# Patient Record
Sex: Male | Born: 1940
Health system: Southern US, Community
[De-identification: ages and names within clinical notes are randomized; demographics above are authoritative.]

## PROBLEM LIST (undated history)

## (undated) DIAGNOSIS — Z8546 Personal history of malignant neoplasm of prostate: Secondary | ICD-10-CM

## (undated) DIAGNOSIS — E785 Hyperlipidemia, unspecified: Secondary | ICD-10-CM

## (undated) DIAGNOSIS — B019 Varicella without complication: Secondary | ICD-10-CM

## (undated) DIAGNOSIS — I10 Essential (primary) hypertension: Secondary | ICD-10-CM

## (undated) DIAGNOSIS — I519 Heart disease, unspecified: Secondary | ICD-10-CM

## (undated) DIAGNOSIS — M1990 Unspecified osteoarthritis, unspecified site: Secondary | ICD-10-CM

## (undated) DIAGNOSIS — I252 Old myocardial infarction: Secondary | ICD-10-CM

## (undated) DIAGNOSIS — N200 Calculus of kidney: Secondary | ICD-10-CM

## (undated) DIAGNOSIS — R519 Headache, unspecified: Secondary | ICD-10-CM

## (undated) DIAGNOSIS — C61 Malignant neoplasm of prostate: Secondary | ICD-10-CM

## (undated) DIAGNOSIS — R51 Headache: Secondary | ICD-10-CM

## (undated) DIAGNOSIS — I2510 Atherosclerotic heart disease of native coronary artery without angina pectoris: Secondary | ICD-10-CM

## (undated) HISTORY — PX: APPENDECTOMY: SHX54

## (undated) HISTORY — DX: Headache: R51

## (undated) HISTORY — DX: Calculus of kidney: N20.0

## (undated) HISTORY — DX: Old myocardial infarction: I25.2

## (undated) HISTORY — DX: Hyperlipidemia, unspecified: E78.5

## (undated) HISTORY — DX: Unspecified osteoarthritis, unspecified site: M19.90

## (undated) HISTORY — DX: Essential (primary) hypertension: I10

## (undated) HISTORY — DX: Personal history of malignant neoplasm of prostate: Z85.46

## (undated) HISTORY — DX: Headache, unspecified: R51.9

## (undated) HISTORY — DX: Atherosclerotic heart disease of native coronary artery without angina pectoris: I25.10

## (undated) HISTORY — DX: Malignant neoplasm of prostate: C61

## (undated) HISTORY — DX: Varicella without complication: B01.9

## (undated) HISTORY — DX: Heart disease, unspecified: I51.9

---

## 1998-12-22 HISTORY — PX: CORONARY ANGIOPLASTY WITH STENT PLACEMENT: SHX49

## 1999-07-15 DIAGNOSIS — I252 Old myocardial infarction: Secondary | ICD-10-CM

## 1999-07-15 HISTORY — DX: Old myocardial infarction: I25.2

## 1999-08-02 ENCOUNTER — Observation Stay (HOSPITAL_COMMUNITY): Admission: AD | Admit: 1999-08-02 | Discharge: 1999-08-03 | Payer: Self-pay | Admitting: Cardiovascular Disease

## 2002-06-09 ENCOUNTER — Ambulatory Visit (HOSPITAL_COMMUNITY): Admission: RE | Admit: 2002-06-09 | Discharge: 2002-06-10 | Payer: Self-pay | Admitting: Otolaryngology

## 2002-06-09 ENCOUNTER — Encounter: Payer: Self-pay | Admitting: Ophthalmology

## 2002-06-09 HISTORY — PX: RETINAL DETACHMENT REPAIR W/ SCLERAL BUCKLE LE: SHX2338

## 2004-08-28 ENCOUNTER — Encounter (INDEPENDENT_AMBULATORY_CARE_PROVIDER_SITE_OTHER): Payer: Self-pay | Admitting: Specialist

## 2004-08-28 ENCOUNTER — Ambulatory Visit (HOSPITAL_COMMUNITY): Admission: RE | Admit: 2004-08-28 | Discharge: 2004-08-28 | Payer: Self-pay | Admitting: Gastroenterology

## 2004-10-15 ENCOUNTER — Encounter: Admission: RE | Admit: 2004-10-15 | Discharge: 2004-10-15 | Payer: Self-pay | Admitting: General Surgery

## 2004-10-17 ENCOUNTER — Ambulatory Visit (HOSPITAL_COMMUNITY): Admission: RE | Admit: 2004-10-17 | Discharge: 2004-10-17 | Payer: Self-pay | Admitting: General Surgery

## 2004-10-17 ENCOUNTER — Ambulatory Visit (HOSPITAL_BASED_OUTPATIENT_CLINIC_OR_DEPARTMENT_OTHER): Admission: RE | Admit: 2004-10-17 | Discharge: 2004-10-17 | Payer: Self-pay | Admitting: General Surgery

## 2004-10-17 HISTORY — PX: UMBILICAL HERNIA REPAIR: SHX196

## 2005-06-06 ENCOUNTER — Encounter: Admission: RE | Admit: 2005-06-06 | Discharge: 2005-06-06 | Payer: Self-pay | Admitting: Orthopedic Surgery

## 2006-07-06 ENCOUNTER — Encounter (INDEPENDENT_AMBULATORY_CARE_PROVIDER_SITE_OTHER): Payer: Self-pay | Admitting: Specialist

## 2006-07-06 ENCOUNTER — Inpatient Hospital Stay (HOSPITAL_COMMUNITY): Admission: RE | Admit: 2006-07-06 | Discharge: 2006-07-07 | Payer: Self-pay | Admitting: Urology

## 2006-07-06 HISTORY — PX: ROBOT ASSISTED LAPAROSCOPIC RADICAL PROSTATECTOMY: SHX5141

## 2008-03-31 ENCOUNTER — Ambulatory Visit (HOSPITAL_COMMUNITY): Admission: RE | Admit: 2008-03-31 | Discharge: 2008-03-31 | Payer: Self-pay | Admitting: Urology

## 2011-05-09 NOTE — Op Note (Signed)
NAME:  Vincent Brock, Vincent Brock NO.:  [id number]   MEDICAL RECORD NO.:  [id number]          PATIENT TYPE:  INP   LOCATION:  0002                         FACILITY:  [facility]   PHYSICIAN:  Valetta Fuller, M.D.  DATE OF BIRTH:  November 09, 1941   DATE OF PROCEDURE:  07/06/2006  DATE OF DISCHARGE:                                 OPERATIVE REPORT   PREOPERATIVE DIAGNOSIS:  Clinical stage T1C adenocarcinoma of the prostate.   POSTOPERATIVE DIAGNOSIS:  Clinical stage T1C adenocarcinoma of the prostate.   PROCEDURE PERFORMED:  Robotic-assisted laparoscopic radical retropubic  prostatectomy.   SURGEON:  Valetta Fuller, M.D.   ASSISTANT:  Crecencio Mc, M.D.   ANESTHESIA:  General endotracheal.   ESTIMATED BLOOD LOSS:  100 cc.   INDICATIONS:  Mr. Jachim is a 70 year old male who was sent to me through  the courtesy of Dr. Marcine Matar to consider the possibility of robotic-  assisted prostatectomy for management of his clinical stage T1C  adenocarcinoma of the prostate.  The patient has a minimally elevated PSA of  approximately 5 with a slightly reduced PSA 2 reading.  He had a moderately  enlarged prostate of 60 g with an AUA symptom score of approximately 12.  The patient had been under the care of Dr. Retta Diones in the past for  significant erectile dysfunction and had been utilizing penile injections  with reasonably good success.  The patient underwent ultrasound biopsy which  revealed left-sided Gleason 3 + 3=6 adenocarcinoma of the prostate.  This  was a relatively low volume with approximately 20% of the biopsy material  involved with cancer.  The patient had extensive discussions with Dr.  Retta Diones and elected to have surgical intervention.  He came to see me to  specifically discuss the pros and cons of a robotic approach for this type  of surgery.  The patient already has significant erectile dysfunction and,  therefore, did not feel that he would be necessarily a  candidate for nerve  spare.  We felt that potentially a larger area of margins by resecting the  nerve, especially on that side, would be prudent since he was already on  penile injections.  The patient appeared to understand the advantages and  disadvantages of a surgical approach in managing this problem.  This was  compared and contrasted to radiation versus observation.  The patient  appeared to understand the advantages and disadvantages of a robotic  approach versus a standard open prostatectomy.  We did have extensive  discussion in our office with regard to potential complications.  We talked  about approximately a 10% to 15% chance of having some stress incontinence  of variable degree.  Again, erectile dysfunction is going to 100% given his  preoperative setting.  We talked about other issues including bladder neck  contracture, urinary incontinence, hematoma formation, DVT, pulmonary  embolus, etc.  The patient did receive preoperative cardiac clearance from  Dr. Elease Hashimoto.   TECHNIQUE AND FINDINGS:  The patient was brought to the operating room where  he had successful induction of general endotracheal anesthesia.  He had  undergone  a mechanical bowel prep and had received perioperative  antibiotics.  He was placed in lithotomy position and carefully secured to  the table utilizing gelpad, as well.  The patient was then placed in  moderately steep Trendelenburg and prepped and draped in the usual manner.  A Foley catheter was inserted sterilely on the field.   We utilized an open Hasson technique to place the camera port cannula.  A  position was chosen just to the left of the umbilicus.  We went lateral to  where he had what appeared to be a mesh umbilical hernia repair.  This site  was approximately 18 cm above the pubic symphysis.  Using standard  techniques, we were able to identify the anterior fascia which was then  incised.  Posterior sheath was then opened.  There was no  evidence of any  abdominal adhesions with finger sweep and the cannula was placed.  Insufflation of the abdomen occurred without difficulty.  The remaining  ports were all placed with direct visual guidance.  We placed three robotic  8-mm trocars, one 12-mm assist, and one 5-mm assist trocar.  These were  placed in a fairly stair configuration and again with direct visual  guidance.  At the completion of this, the robot was then docked.   Attention was initially turned toward dissection of the bladder.  This was  dropped posteriorly by opening the fascia between the obliterated umbilical  ligaments and developing the space of Retzius.  Once this was accomplished,  the lateral pelvic fascia was incised overlying the prostate and the levator  musculature was swept away utilizing a combination of sharp and blunt  dissective technique.  We also defatted the prostate and bladder neck region  to help identify the apex and bladder neck regions.  Once the apex was fully  developed, an ETS stapling device was used to take the dorsal vein complex  and no significant bleeding occurred.   With manipulation of the catheter and the Foley balloon, we were able to  identify the bladder neck.  Anteriorly, this was then taken down utilizing  electric scissors.  Once the Foley catheter was identified, it was brought  up anteriorly and the prostate was retracted anteriorly.  Careful inspection  revealed no obvious middle lobe and the posterior bladder neck was then  transected.  Once we had completely transected the bladder neck, the  underlying vas and seminal vesicle structures were identified in the  midline.  Each of these structures were individually dissected free.  These  planes were all quite normal.  There was no significant difficulty in  identifying and completely dissecting free both vas deferens which were  transected with electrocautery and the seminal vesicles.  Once this was accomplished, we  were able to establish a very nice plane posteriorly  between the prostate and rectum.   Attention was then turned toward the division of the pedicles.  On the right  side, the neurovascular bundle area was swept away to a leftward nerve  sparing on that side.  On the left, it was a wide excision.  By lifting the  prostate anteriorly, the vascular pedicles of the prostate were taken down  with Hemoclips.  A little bit of lateral fascia was taken off at the apex  bilaterally.  The urethra was then transected anteriorly.  The Foley  catheter was removed and the entire urethra was then transected.  The  prostatic specimen was then free and brought up out of  the pelvis.   Copious irrigation in the pelvis was then performed.  Air was insufflated  into the rectum through a rectal Foley and no evidence of any rectal injury  was encountered.  Some small oozing vessels were taken care of with the  bipolar cautery.   Attention was then turned to reconstruction.  There was a very nice urethral  stump.  The anastomosis was done with a double-armed running 3-0 Monocryl  suture in a 360-degree manner, starting posteriorly.  At the completion of  this, a new 18-French coude catheter was placed.  The bladder was irrigated  and no evidence of leakage occurred.  Through one of the robotic trocars, we  placed a Blake drain and it was positioned at the anastomosis down deep in  the pelvis.  This was then secured to the anterior abdominal wall.  The  specimen itself was removed with the Endopouch.  Of note, prior to the  anastomosis, there was a small little area of nodularity right at the  posterior aspect of the bladder neck.  For that reason, we took an  additional margin of the posterior bladder neck which was sent separately.  This was sent for permanent specimen only.  There was the possibility that  there might have been a very, very small little middle lobe component just  underneath the mucosa in  that area.  Also of note, the ureteral orifices  were identified during the transection, as well as reconstruction by  administrating indigo carmine.  The 12-mm assist port was closed with a  Vicryl suture utilizing a suture passer.  All the ports were removed with  direct vision.  Once the specimen was removed, the camera port incision was  closed with a running 0 Vicryl suture.  All wounds were  infiltrated with some Marcaine and skin was closed with clips.  The  patient's urine was a light blue at the completion of the procedure.  Again,  blood loss was minimal at about 100 cc.  He remained stable throughout the  procedure.  He was brought to the recovery room in stable condition.           ______________________________  Valetta Fuller, M.D.  Electronically Signed     DSG/MEDQ  D:  07/06/2006  T:  07/06/2006  Job:  [id number]   cc:   Vesta Mixer, M.D.  Fax: [phone]   Elana Alm. Nicholos Johns, M.D.  Fax: [phone]   Bertram Millard. Dahlstedt, M.D. Fax: [phone]

## 2011-05-09 NOTE — Op Note (Signed)
NAME:  Vincent Brock, Vincent Brock NO.:  [id number]   MEDICAL RECORD NO.:  [id number]                   PATIENT TYPE:  AMB   LOCATION:  DFTL                                 FACILITY:  [facility]   PHYSICIAN:  Anselmo Rod, M.D.               DATE OF BIRTH:  05-13-1941   DATE OF PROCEDURE:  08/28/2004  DATE OF DISCHARGE:                                 OPERATIVE REPORT   PROCEDURE PERFORMED:  Colonoscopy with biopsies x4.   ENDOSCOPIST:  Charna Elizabeth, M.D.   INSTRUMENT USED:  Olympus video colonoscope.   INDICATION FOR PROCEDURE:  A 70 year old white male with a history of blood  in stool, rule out colonic polyps, masses, etc.   PROCEDURE PREPARATION:  Informed consent was procured from the patient.  The  patient was fasted for 8 hours prior to the procedure and prepped with a  bottle of magnesium citrate and a gallon of GoLYTELY the night prior to the  procedure.   PREPROCEDURE PHYSICAL:  Patient stable vital signs.  NECK:  Supple.  CHEST:  Clear to auscultation.  S1, S2 regular.  ABDOMEN:  Soft with normal bowel sounds.   DESCRIPTION OF THE PROCEDURE:  The patient was placed the left lateral  decubitus position, sedated with 100 mg of Demerol and 10 mg of Versed  intravenously.  This was done in slow incremental doses.  Once the patient  was adequately sedated and maintained on low flow oxygen and continuous  cardiac monitoring, the Olympus video colonoscope was advanced from the  rectum to the cecum with difficulty.  There was a large amount of residual  stool in the colon, multiple washings were done.  There was extensive  sigmoid diverticulosis, internal hemorrhoids were seen.  On retroflexion, 4  small sessile polyps were biopsied from the rectosigmoid colon.  Small  lesions could have been missed.  The procedure was completed up to the  cecum.  The patient's position was changed from the left lateral to the  supine position with gentle application of  abdominal pressure.   IMPRESSION:  1.  Small internal hemorrhoids.  2.  Extensive sigmoid diverticulosis.  3.  Four small sessile polyps biopsied from rectosigmoid colon.  4.  Some residual stool in the colon, small lesions could have been missed.   RECOMMENDATIONS:  1.  Await pathology results.  2.  Avoid nonsteroidals including aspirin for the next 2 weeks.  3.  Outpatient followup in the next 2 weeks for further recommendations.                                               Anselmo Rod, M.D.    JNM/MEDQ  D:  08/28/2004  T:  08/28/2004  Job:  [id number]   cc:   Gabriel Earing,  M.D.  [address]  [place]  [place] [address]  Fax: [phone]

## 2011-05-09 NOTE — Op Note (Signed)
[place]  Patient:    Vincent Brock, Vincent Brock Visit Number: [id number] MRN: [id number]          Service Type: DSU Location: [id number] Attending Physician:  Bertrum Sol Dictated by:   Beulah Gandy. Ashley Royalty, M.D. Proc. Date: 06/09/02 Admit Date:  06/09/2002 Discharge Date: 06/10/2002                             Operative Report  DATE OF BIRTH:  [birth year].  ADMISSION DIAGNOSIS: Retinal detachment, right eye.  PROCEDURES PERFORMED: 1. Scleral buckle right eye. 2. Pars plana vitrectomy right eye. 3. Gas fluid exchange right eye. 4. Perfluoropropane injection right eye. 5. Membrane peel right eye. 6. C3F8 injection 14% right eye.  SURGEON:  Beulah Gandy. Ashley Royalty, M.D.  ASSISTANT SURGEON:  Winfred Burn, COA, [place].  ANESTHESIA:  General.  DETAILS: Usual prep and drape. A 360 degree limbal peritomy isolation of four rectus muscles on 2-0 silk. Localization of break at 5 oclock. Scleral dissection from 2 oclock to 10 oclock to admit a #279 intrascleral implant. Diathermy was placed in the bed. Additional posterior dissection was carried out at 5 oclock to admit a #508G radial segment. A #279 implant was placed against the globe and the scleral flaps were closed with 4-0 Mersilene suture. A 240 band was placed around the eye with a belt loop at 12 oclock and a 270 sleeve at 1 oclock.  The perforation site chosen at 5 oclock in the posterior aspect of the bed. A large amount of clear, colorless subretinal fluid came fourth. The scleral buckle elements were replaced and these flaps were closed. 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 The edge of the tear was elevated and rolled.  At this point a decision for vitrectomy was made. The attention was carried to the pars plana area where sclerotomies were made at 8, 10 and 2 oclock. A 6 mm infusion port was placed at 8 oclock. The  contact lens ring was anchored in place at 6 and 12 oclock. The pars plana vitrectomy was begun just behind the pseudophakos. Dense vitreous membranes were encountered. These were carefully removed under low suction and rapid cutting.  The attention was carried to the area of detachment and the folds were encountered. These were carefully stripped with the 27 gauge pick and vitreous forceps. Once all of the folds were unfolded and the vitreous surface tissue was removed perfluoron was injected into the vitreous cavity to reattach the retina.  At this point the endolaser was positioned in the eye and 207 burns were placed around the retinal break with a power of 400 milliwatts, 1000 microns each at 0.1 seconds each. The retina was fully attached.  A gas-fluid exchange was then carried out. A gas exchange was then carried out with 14% perfluoropropane remaining in the vitreous cavity. The instruments were removed from the eye and 9-0 nylon was used to close the sclerotomy sites. The scleral buckle was closed with 4-0 interrupted Mersilene suture.  The band was adjusted to a proper indentation of the globe. The conjunctiva was reposited with 7-0 chromic suture. Polymyxin and gentamicin were irrigated into Tenons space. Atropine solution was applied. Decadron 10 mg was injected into the lower subconjunctival space. Marcaine was injected around the globe for postoperative pain. Polysporin, a patch and shield were placed.  The patient was awakened and taken to the  recovery room in satisfactory condition. Closing tension was 10 with a Barraquer tonometer. There no complications. Duration was two hours. The patient was awakened and taken to the recovery room in satisfactory condition. Dictated by:   Beulah Gandy. Ashley Royalty, M.D. Attending Physician:  Bertrum Sol DD:  06/09/02 TD:  06/11/02 Job: [id number]

## 2011-05-09 NOTE — Op Note (Signed)
NAME:  LUNDEN, MCLEISH NO.:  [id number]   MEDICAL RECORD NO.:  [id number]          PATIENT TYPE:  AMB   LOCATION:  DSC                          FACILITY:  [facility]   PHYSICIAN:  Adolph Pollack, M.D.DATE OF BIRTH:  [birth year]   DATE OF PROCEDURE:  10/17/2004  DATE OF DISCHARGE:                                 OPERATIVE REPORT   PREOPERATIVE DIAGNOSIS:  Umbilical hernia.   POSTOPERATIVE DIAGNOSIS:  Umbilical hernia.   OPERATION PERFORMED:  Umbilical hernia repair with mesh.   SURGEON:  Adolph Pollack, M.D.   ANESTHESIA:  General.   INDICATIONS FOR PROCEDURE:  Mr. Vincent Brock is a 70 year old male who has  noticed an umbilical bulge that is starting to become larger and somewhat  uncomfortable at times.  It reducible in the office.  It is consistent with  an umbilical hernia and he now presents for repair.  The procedure and the  risks were discussed with him preoperatively.  He has been having some sinus  drainage and a little bit of a cough but thinks it is just a sinus problem  and not a upper respiratory infection.  He has been afebrile.  After  discussion with him, he wants to proceed.  Temperature this morning is 97.   DESCRIPTION OF PROCEDURE:  He was seen in the holding area, then brought to  the operating room and placed supine on the operating room table and general  anesthetic was administered.  The periumbilical area was clipped of its  hair, then sterilely prepped and draped.  Local anesthetic consisting of  0.5% plain Marcaine was infiltrated in the periumbilical region and a  subumbilical incision was made through the skin and subcutaneous tissue down  to the fascia.  I then encircled the umbilicus and dissected it free from  the fascia exposing the underlying umbilical hernia.  Using electrocautery I  exposed the fascia for 3 to 4 cm around the defect.  I then permanently  closed the defect with 0 Surgilon sutures leaving the sutures long.  A  piece  of polypropylene mesh was then brought onto the field and cut to appropriate  size.  The sutures that have previously been used for the primary repair  were then threaded up through the mesh and then they were tied down  anchoring the mesh over the primary repair.  The periphery of the mesh was  then anchored to the fascia with running 0 Surgilon suture.  This provided  more than adequate coverage of the primary repair and defect as well as with  adequate overlap.   Hemostasis was adequate at this time.  The umbilicus was reimplanted to the  mesh with a 3-0 Vicryl suture.  The subcutaneous tissue was closed over the  mesh with a running 3-0 Vicryl suture.  The skin was closed with a 4-0  Monocryl subcutaneous stitch.  Steri-Strips and a sterile dressing were  applied.   The patient tolerated the procedure well without any apparent complications  and was taken to the recovery room in satisfactory condition.  Discharge  instructions will be given to him  as well as some Tylox for pain and he will  follow up with me in two to three weeks.       TJR/MEDQ  D:  10/17/2004  T:  10/17/2004  Job:  [id number]   cc:   Anselmo Rod, M.D.  [address]  [address]  [place]  [place] [address]  Fax: [phone]

## 2011-05-30 ENCOUNTER — Other Ambulatory Visit: Payer: Self-pay | Admitting: Cardiovascular Disease

## 2011-05-30 ENCOUNTER — Telehealth: Payer: Self-pay | Admitting: *Deleted

## 2011-05-30 NOTE — Telephone Encounter (Signed)
Pt called and said was given a script through amy rn/nahser a few months ago but was only given one refill, i called and left msg he need an appointment.Alfonso Ramus RN

## 2011-05-30 NOTE — Telephone Encounter (Signed)
Pt called no current chart and not seen in hospital, dr Ian Bushman ok with one refill, needs app.Alfonso Ramus RN

## 2011-06-02 ENCOUNTER — Telehealth: Payer: Self-pay | Admitting: Cardiovascular Disease

## 2011-06-02 ENCOUNTER — Other Ambulatory Visit: Payer: Self-pay | Admitting: *Deleted

## 2011-06-02 DIAGNOSIS — E785 Hyperlipidemia, unspecified: Secondary | ICD-10-CM

## 2011-06-02 NOTE — Telephone Encounter (Signed)
Called in wanting to

## 2011-06-02 NOTE — Telephone Encounter (Signed)
Lab and ov set

## 2011-06-12 ENCOUNTER — Telehealth: Payer: Self-pay | Admitting: Cardiovascular Disease

## 2011-06-19 ENCOUNTER — Other Ambulatory Visit: Payer: Self-pay | Admitting: Orthopedic Surgery

## 2011-06-19 ENCOUNTER — Encounter: Payer: Self-pay | Admitting: *Deleted

## 2011-06-19 ENCOUNTER — Other Ambulatory Visit (HOSPITAL_COMMUNITY): Payer: Self-pay | Admitting: Orthopedic Surgery

## 2011-06-19 ENCOUNTER — Telehealth: Payer: Self-pay | Admitting: Cardiovascular Disease

## 2011-06-19 ENCOUNTER — Encounter (HOSPITAL_COMMUNITY): Payer: Medicare Other

## 2011-06-19 ENCOUNTER — Ambulatory Visit (HOSPITAL_COMMUNITY)
Admission: RE | Admit: 2011-06-19 | Discharge: 2011-06-19 | Disposition: A | Discharge status: Home / Self Care | Payer: Medicare Other | Source: Ambulatory Visit | Attending: Orthopedic Surgery | Admitting: Orthopedic Surgery

## 2011-06-19 DIAGNOSIS — Z01818 Encounter for other preprocedural examination: Secondary | ICD-10-CM

## 2011-06-19 DIAGNOSIS — I10 Essential (primary) hypertension: Secondary | ICD-10-CM | POA: Insufficient documentation

## 2011-06-19 DIAGNOSIS — I252 Old myocardial infarction: Secondary | ICD-10-CM | POA: Insufficient documentation

## 2011-06-19 DIAGNOSIS — M1710 Unilateral primary osteoarthritis, unspecified knee: Secondary | ICD-10-CM | POA: Insufficient documentation

## 2011-06-19 DIAGNOSIS — Z01812 Encounter for preprocedural laboratory examination: Secondary | ICD-10-CM | POA: Insufficient documentation

## 2011-06-19 DIAGNOSIS — I2510 Atherosclerotic heart disease of native coronary artery without angina pectoris: Secondary | ICD-10-CM | POA: Insufficient documentation

## 2011-06-19 DIAGNOSIS — Z01810 Encounter for preprocedural cardiovascular examination: Secondary | ICD-10-CM | POA: Insufficient documentation

## 2011-06-19 LAB — DIFFERENTIAL
Basophils Absolute: 0 10*3/uL (ref 0.0–0.1)
Basophils Relative: 0 % (ref 0–1)
Eosinophils Absolute: 0.1 10*3/uL (ref 0.0–0.7)
Eosinophils Relative: 3 % (ref 0–5)
Lymphocytes Relative: 35 % (ref 12–46)
Monocytes Absolute: 0.4 10*3/uL (ref 0.1–1.0)
Monocytes Relative: 10 % (ref 3–12)
Neutro Abs: 2.2 10*3/uL (ref 1.7–7.7)
Neutrophils Relative %: 52 % (ref 43–77)

## 2011-06-19 LAB — CBC
HCT: 44 % (ref 39.0–52.0)
Hemoglobin: 14.6 g/dL (ref 13.0–17.0)
MCH: 30.4 pg (ref 26.0–34.0)
MCHC: 33.2 g/dL (ref 30.0–36.0)
MCV: 91.5 fL (ref 78.0–100.0)
Platelets: 202 10*3/uL (ref 150–400)
RBC: 4.81 MIL/uL (ref 4.22–5.81)
WBC: 4.3 10*3/uL (ref 4.0–10.5)

## 2011-06-19 LAB — URINALYSIS, ROUTINE W REFLEX MICROSCOPIC
Bilirubin Urine: NEGATIVE
Glucose, UA: NEGATIVE mg/dL
Ketones, ur: NEGATIVE mg/dL
Leukocytes, UA: NEGATIVE
Nitrite: NEGATIVE
Protein, ur: NEGATIVE mg/dL
Specific Gravity, Urine: 1.018 (ref 1.005–1.030)
Urobilinogen, UA: 0.2 mg/dL (ref 0.0–1.0)
pH: 7 (ref 5.0–8.0)

## 2011-06-19 LAB — COMPREHENSIVE METABOLIC PANEL
ALT: 19 U/L (ref 0–53)
AST: 22 U/L (ref 0–37)
Albumin: 3.8 g/dL (ref 3.5–5.2)
Alkaline Phosphatase: 79 U/L (ref 39–117)
BUN: 15 mg/dL (ref 6–23)
CO2: 29 mEq/L (ref 19–32)
Calcium: 9.3 mg/dL (ref 8.4–10.5)
Chloride: 105 mEq/L (ref 96–112)
Creatinine, Ser: 0.81 mg/dL (ref 0.50–1.35)
GFR calc Af Amer: >60 mL/min (ref >60)
GFR calc non Af Amer: >60 mL/min (ref >60)
Glucose, Bld: 99 mg/dL (ref 70–99)
Potassium: 4.6 mEq/L (ref 3.5–5.1)
Sodium: 141 mEq/L (ref 135–145)
Total Bilirubin: 0.5 mg/dL (ref 0.3–1.2)
Total Protein: 7 g/dL (ref 6.0–8.3)

## 2011-06-19 LAB — URINE MICROSCOPIC-ADD ON

## 2011-06-19 LAB — PROTIME-INR
INR: 1.1 (ref 0.00–1.49)
Prothrombin Time: 14.4 seconds (ref 11.6–15.2)

## 2011-06-19 LAB — SURGICAL PCR SCREEN
MRSA, PCR: NEGATIVE
Staphylococcus aureus: NEGATIVE

## 2011-06-19 LAB — APTT: aPTT: 37 seconds (ref 24–37)

## 2011-06-19 NOTE — Telephone Encounter (Signed)
Fax: [phone] OV, STRESS

## 2011-06-26 ENCOUNTER — Ambulatory Visit (INDEPENDENT_AMBULATORY_CARE_PROVIDER_SITE_OTHER): Payer: Medicare Other | Admitting: Cardiovascular Disease

## 2011-06-26 ENCOUNTER — Other Ambulatory Visit (INDEPENDENT_AMBULATORY_CARE_PROVIDER_SITE_OTHER): Payer: Medicare Other | Admitting: *Deleted

## 2011-06-26 ENCOUNTER — Encounter: Payer: Self-pay | Admitting: Cardiovascular Disease

## 2011-06-26 VITALS — BP 120/88 | HR 66 | Wt 190.0 lb

## 2011-06-26 DIAGNOSIS — E785 Hyperlipidemia, unspecified: Secondary | ICD-10-CM

## 2011-06-26 DIAGNOSIS — I2510 Atherosclerotic heart disease of native coronary artery without angina pectoris: Secondary | ICD-10-CM

## 2011-06-26 DIAGNOSIS — Z01810 Encounter for preprocedural cardiovascular examination: Secondary | ICD-10-CM

## 2011-06-26 LAB — BASIC METABOLIC PANEL
BUN: 18 mg/dL (ref 6–23)
CO2: 24 mEq/L (ref 19–32)
Calcium: 8.7 mg/dL (ref 8.4–10.5)
Chloride: 110 mEq/L (ref 96–112)
Creatinine, Ser: 0.9 mg/dL (ref 0.4–1.5)
GFR: 87.73 mL/min (ref >60.00)
Glucose, Bld: 111 mg/dL — ABNORMAL HIGH (ref 70–99)
Potassium: 3.9 mEq/L (ref 3.5–5.1)
Sodium: 138 mEq/L (ref 135–145)

## 2011-06-26 LAB — LIPID PANEL
Cholesterol: 125 mg/dL (ref 0–200)
HDL: 53.3 mg/dL (ref >39.00)
LDL Cholesterol: 59 mg/dL (ref 0–99)
Total CHOL/HDL Ratio: 2
Triglycerides: 62 mg/dL (ref 0.0–149.0)
VLDL: 12.4 mg/dL (ref 0.0–40.0)

## 2011-06-26 LAB — HEPATIC FUNCTION PANEL
ALT: 20 U/L (ref 0–53)
AST: 23 U/L (ref 0–37)
Albumin: 4.1 g/dL (ref 3.5–5.2)
Alkaline Phosphatase: 66 U/L (ref 39–117)
Bilirubin, Direct: 0.1 mg/dL (ref 0.0–0.3)
Total Bilirubin: 0.7 mg/dL (ref 0.3–1.2)
Total Protein: 6.9 g/dL (ref 6.0–8.3)

## 2011-06-26 NOTE — Progress Notes (Signed)
Vincent Brock Date of Birth  17-May-1941 [facility] [address]     [address] [place], [place]  [address] [phone]  Fax  [phone]  History of Present Illness:  Pt has a history of CAD - stent to the mid RCA.  Doing well. No angina.  Not exercising as much as he needs to due to knee problems. Needs to have total knee.  He has not had any episodes of chest pain quite sometime. His last stress test was in 2007 which Revealed an old inferior wall myocardial infarction but was otherwise normal.    Current Outpatient Prescriptions on File Prior to Visit  Medication Sig Dispense Refill  . CRESTOR 20 MG tablet Take 1 tablet by mouth Daily.      . metoprolol (LOPRESSOR) 100 MG tablet take 1/2 tablet by mouth twice a day  60 tablet  0    No Known Allergies  Past Medical History  Diagnosis Date  . Hyperlipidemia   . Hypertension   . Coronary artery disease     status post PTCA and stenting of his right coronary artery in 2000  . History of prostate cancer   . 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 History of myocardial infarction 07/15/1999    cardiac catherization revealed a tight 99% stenosis in mid right coronary artery -- with successful PTCA and stenting of mid right    Past Surgical History  Procedure Date  . Robot assisted laparoscopic radical prostatectomy 07/06/2006    Clinical stage T1C adenocarcinoma of the prostate  . Umbilical hernia repair 10/17/2004  . Retinal detachment repair w/ scleral buckle le 06/09/02    Retinal detachment, right eye  . Coronary angioplasty with stent placement 2000    stenting of RCA  . Appendectomy     History  Smoking status  . Former Smoker  . Quit date: 10/22/2005  Smokeless tobacco  . Not on file    History  Alcohol Use     Family History  Problem Relation Age of Onset  . Heart attack Father 34  . Cirrhosis Mother   . Coronary artery disease  Sister     with stents  . Tuberculosis Father   . Heart failure Father     Reviw of Systems:  Reviewed in the HPI.  All other systems are negative.  Physical Exam: BP 120/88  Pulse 66  Wt 190 lb (86.183 kg) The patient is alert and oriented x 3.  The mood and affect are normal.   Skin: warm and dry.  Color is normal.    HEENT:   the sclera are nonicteric.  The mucous membranes are moist.  The carotids are 2+ without bruits.  There is no thyromegaly.  There is no JVD.    Lungs: clear.  The chest wall is non tender.    Heart: regular rate with a normal S1 and S2.  There are no murmurs, gallops, or rubs. The PMI is not displaced.     Abdomin: good bowel sounds.  There is no guarding or rebound.  There is no hepatosplenomegaly or tenderness.  There are no masses.   Extremities:  no clubbing, cyanosis, or edema.  The legs are without rashes.  The distal pulses are intact.   Neuro:  Cranial nerves II - XII are intact.  Motor and sensory functions are intact.    The gait is normal.  ECG: From [facility] reveals normal sinus rhythm. He is very tiny inferior Q waves. Otherwise EKG is unremarkable. Assessment / Plan:

## 2011-06-26 NOTE — Assessment & Plan Note (Signed)
Vincent Brock has done very well from a coronary standpoint. He has not had any episodes of chest pain or shortness of breath. He should be at low risk for his upcoming knee surgery. I would prefer that he stay on aspirin if possible. He does have a stent and  aspirin therapy would be beneficial to help keep the stent open during  surgery.

## 2011-06-27 ENCOUNTER — Other Ambulatory Visit: Payer: Self-pay | Admitting: *Deleted

## 2011-06-27 MED ORDER — NITROGLYCERIN 0.4 MG SL SUBL: SUBLINGUAL_TABLET | SUBLINGUAL | Status: DC

## 2011-06-27 NOTE — Telephone Encounter (Signed)
Patient called with lab results. Patient request refill. Pt verbalized understanding. Jodette Jessamine Barcia RN  

## 2011-07-01 ENCOUNTER — Inpatient Hospital Stay (HOSPITAL_COMMUNITY)
Admission: RE | Admit: 2011-07-01 | Discharge: 2011-07-03 | DRG: 470 | Disposition: A | Discharge status: Home Health | Payer: Medicare Other | Source: Ambulatory Visit | Attending: Orthopedic Surgery | Admitting: Orthopedic Surgery

## 2011-07-01 DIAGNOSIS — J449 Chronic obstructive pulmonary disease, unspecified: Secondary | ICD-10-CM | POA: Diagnosis present

## 2011-07-01 DIAGNOSIS — I2510 Atherosclerotic heart disease of native coronary artery without angina pectoris: Secondary | ICD-10-CM | POA: Diagnosis present

## 2011-07-01 DIAGNOSIS — J4489 Other specified chronic obstructive pulmonary disease: Secondary | ICD-10-CM | POA: Diagnosis present

## 2011-07-01 DIAGNOSIS — M1710 Unilateral primary osteoarthritis, unspecified knee: Principal | ICD-10-CM | POA: Diagnosis present

## 2011-07-01 DIAGNOSIS — E7800 Pure hypercholesterolemia, unspecified: Secondary | ICD-10-CM | POA: Diagnosis present

## 2011-07-01 DIAGNOSIS — Z01812 Encounter for preprocedural laboratory examination: Secondary | ICD-10-CM

## 2011-07-01 DIAGNOSIS — H9190 Unspecified hearing loss, unspecified ear: Secondary | ICD-10-CM | POA: Diagnosis present

## 2011-07-01 DIAGNOSIS — Z9861 Coronary angioplasty status: Secondary | ICD-10-CM

## 2011-07-01 LAB — TYPE AND SCREEN
ABO/RH(D): O POS
Antibody Screen: NEGATIVE

## 2011-07-02 LAB — CBC
HCT: 34.2 % — ABNORMAL LOW (ref 39.0–52.0)
Hemoglobin: 11.2 g/dL — ABNORMAL LOW (ref 13.0–17.0)
MCH: 30.2 pg (ref 26.0–34.0)
MCHC: 32.7 g/dL (ref 30.0–36.0)
MCV: 92.2 fL (ref 78.0–100.0)
Platelets: 146 10*3/uL — ABNORMAL LOW (ref 150–400)
RBC: 3.71 MIL/uL — ABNORMAL LOW (ref 4.22–5.81)
RDW: 14.1 % (ref 11.5–15.5)
WBC: 10.2 10*3/uL (ref 4.0–10.5)

## 2011-07-02 LAB — BASIC METABOLIC PANEL
BUN: 17 mg/dL (ref 6–23)
CO2: 26 mEq/L (ref 19–32)
Calcium: 8 mg/dL — ABNORMAL LOW (ref 8.4–10.5)
Chloride: 107 mEq/L (ref 96–112)
Creatinine, Ser: 0.71 mg/dL (ref 0.50–1.35)
GFR calc Af Amer: >60 mL/min (ref >60)
GFR calc non Af Amer: >60 mL/min (ref >60)
Glucose, Bld: 142 mg/dL — ABNORMAL HIGH (ref 70–99)
Potassium: 3.7 mEq/L (ref 3.5–5.1)
Sodium: 139 mEq/L (ref 135–145)

## 2011-07-03 LAB — CBC
HCT: 30.4 % — ABNORMAL LOW (ref 39.0–52.0)
Hemoglobin: 10.3 g/dL — ABNORMAL LOW (ref 13.0–17.0)
MCH: 31 pg (ref 26.0–34.0)
MCHC: 33.9 g/dL (ref 30.0–36.0)
MCV: 91.6 fL (ref 78.0–100.0)
Platelets: 136 10*3/uL — ABNORMAL LOW (ref 150–400)
RBC: 3.32 MIL/uL — ABNORMAL LOW (ref 4.22–5.81)
RDW: 14.2 % (ref 11.5–15.5)
WBC: 8.8 10*3/uL (ref 4.0–10.5)

## 2011-07-03 LAB — BASIC METABOLIC PANEL
BUN: 11 mg/dL (ref 6–23)
CO2: 27 mEq/L (ref 19–32)
Calcium: 8 mg/dL — ABNORMAL LOW (ref 8.4–10.5)
Chloride: 107 mEq/L (ref 96–112)
Creatinine, Ser: 0.81 mg/dL (ref 0.50–1.35)
GFR calc Af Amer: >60 mL/min (ref >60)
GFR calc non Af Amer: >60 mL/min (ref >60)
Glucose, Bld: 116 mg/dL — ABNORMAL HIGH (ref 70–99)
Potassium: 3.6 mEq/L (ref 3.5–5.1)
Sodium: 139 mEq/L (ref 135–145)

## 2011-07-07 NOTE — Op Note (Signed)
NAME:  Vincent Brock, Vincent Brock NO.:  [id number]  MEDICAL RECORD NO.:  [id number]  LOCATION:  0008                         FACILITY:  [facility]  PHYSICIAN:  Madlyn Frankel. Charlann Boxer, M.D.  DATE OF BIRTH:  04-17-1941  DATE OF PROCEDURE:  07/01/2011 DATE OF DISCHARGE:                              OPERATIVE REPORT   PREOPERATIVE DIAGNOSIS:  Left knee osteoarthritis.  POSTOPERATIVE DIAGNOSIS:  Left knee osteoarthritis.  PROCEDURE:  Left total knee replacement.  COMPONENTS USED:  DePuy rotating platform posterior stabilized knee system size 6 femur, 5 tibia, 12.5 insert, and a 41 patellar button.  SURGEON:  Madlyn Frankel. Charlann Boxer, MD  ASSISTANT:  Jaquelyn Bitter. Chabon, PA-C  ANESTHESIA:  Spinal.  SPECIMENS:  None.  COMPLICATIONS:  None.  DRAINS:  One Hemovac.  TOURNIQUET TIME:  44 minutes at 250 mmHg.  INDICATIONS OF PROCEDURE:  Vincent Brock is a 70 year old gentleman with bilateral knee advanced degenerative changes, left greater than right. He had failed responding to conservative measures including injections. His quality of life was diminished based on his arthritis and he wished at this point to proceed with more definitive measures.  Risks and benefits reviewed including the postoperative course and expectations, the risk of infection, DVT, component failure as well as review of his medical comorbidities.  Consent was obtained for benefit of pain relief.  PROCEDURE IN DETAIL:  The patient was brought to operative theater. Once adequate anesthesia, preoperative antibiotics, Ancef administered, the patient was positioned supine with left thigh tourniquet placed. Left lower extremity was then prepped and draped in sterile fashion. The left foot was placed in the [facility] leg holder.  Time-out was performed identifying the patient, planned procedure, and extremity. Leg was exsanguinated, tourniquet elevated to 250 mmHg.  A midline incision was made followed by median arthrotomy.   Following less aggressive medial proximal peel due to valgus nature of his knee and slight valgus release initially, attention was directed to the patella. Precut measurement was noted to be about 24.5 mm.  He had very thick patella.  I resected down to about 14 mm using a 41 patellar button to restore height.  Lug holes were drilled and a metal shim placed to protect the patella from retractors and saw blades.  Attention was now directed to the femur.  Femoral canal was opened with a drill, irrigated to try to prevent fat emboli.  The patient was noted to have a very large femur and after initial distal femoral cut of 12 mm, probably about 11 mm, I sized the femur to be a size 6.  The tibia was then subluxated anteriorly and using extramedullary guide, an initial resection of 10 mm of bone was made.  When I brought the knee to extension with an extension block, the gap was still tight.  Thus, I removed 2 more millimeters of bone off the proximal tibia following removal cleaning up this bone.  We confirmed that the cut was perpendicular in the coronal plane.  At this point, I sized the femur to be a size 6.  Again with a size 6, rotation block was pinned in position anterior reference using the C clamp to set rotation.  A 4:1  cutting block was pinned into position. Anterior and posterior chamfer cuts were made without difficulty nor notching followed by the box cut.  Final preparation of the tibia was carried out with a size 5 tibial tray sitting on the cut surface, pinned into position, drilled, and keel punched.  The trial reduction at this point, a 6 femur was placed in the knee as long as the size 5 tibia and the 10 mm insert, I did not feel the knee came out all the way to full extension.  The patella tracked through the trochlea without application of pressure.  At this point, I made a decision to remove 2 more millimeters of bone off the distal femur.  I went ahead and  re-placed the distal femoral cutting block followed by revisiting the chamfer cuts and box cut.  Interestingly enough, however, with this and further debridement of the posterior aspect of knee including remaining meniscus, etc.  When I redid a trial, felt happy actually with a 12.5 insert than the 10.  I did not think I removed this amount of bone.  I just think that the soft tissue release balances better, particularly as the knee went into flexion.  Given all these findings, the trial components were removed.  We drilled sclerotic holes into the sclerotic bone, removed remaining debris off the posterior aspect of the knee.  The knee was irrigated with normal saline solution.  The synovial capsule junction of the knee was injected with 0.25% Marcaine with epinephrine, 1 cc of Toradol.  The final components were opened, holding a polyethylene insert.  The cement was mixed.  Final components were cemented onto clean and dried cut surfaces of the bone.  The knee was brought to extension with a 12.5 insert, I was happy that the knee came to full extension with the 12.5 insert.  I ended up choosing this once the cement had fully cured and excessive cement was removed throughout the knee.  The final 12.5 insert had been chosen, opened, and inserted into the knee. Tourniquet had been let down after 44 minutes without significant hemostasis required.  A medium Hemovac drain was placed deep.  We re-irrigated the knee with pulse lavage.  The extensor mechanism was then re-approximated over the drain with #1 Vicryl with the knee in flexion.  The remaining of the wound was closed with 2-0 Vicryl and running 4-0 Monocryl.  The knee was cleaned, dried, and dressed sterilely using Dermabond and Aquacel dressing.  He was then brought to recovery room in stable condition tolerating the procedure well.     Madlyn Frankel Charlann Boxer, M.D.     MDO/MEDQ  D:  07/01/2011  T:  07/01/2011  Job:   [id number]  Electronically Signed by Durene Romans M.D. on 07/07/2011 09:39:24 AM

## 2011-07-07 NOTE — H&P (Signed)
NAME:  [name], [name] NO.:  [id number]  MEDICAL RECORD NO.:  [id number]  LOCATION:                                 FACILITY:  PHYSICIAN:  Madlyn Frankel. Charlann Boxer, M.D.  DATE OF BIRTH:  [birth year]  DATE OF ADMISSION:  07/01/2011 DATE OF DISCHARGE:                             HISTORY & PHYSICAL   PRIMARY CARE PHYSICIAN:  Alanson Puls, M.D.  CARDIOLOGIST:  Vesta Mixer, M.D.  UROLOGIST:  Bertram Millard. Dahlstedt, M.D.  ADMITTING DIAGNOSIS:  Left knee osteoarthritis.  BRIEF HISTORY:  Vincent Brock is a 70 year old Brock, who has been seen, evaluated and followed in the office for advanced left knee osteoarthritis confirmed clinically and radiographically.  He had failed conservative measures and injections, therapy, medication and at this point is ready to proceed with more definitive measures.  He had been seen and evaluated in the office.  Risks had been established, reviewed and consent obtained based on his hospital course.  He was seen and evaluated history and physical on June 26, 2011 and had obtained medical clearance through Dr. Elease Hashimoto.  He received in the office today his postoperative prescription for MiraLax, Colace, aspirin, iron and Robaxin.  Pain medicines will be given at the time of discharge.  He does plan at this point to go home with home health physical therapy.  PAST MEDICAL HISTORY:  Includes: 1. Coronary artery disease with history of stenting. 2. He has a history of some respiratory difficulties and COPD. 3. He has had a history of kidney stones and hypercholesterolemia. 4. He has had a history of some hearing loss and cataract surgery.  CURRENT MEDICATIONS:  Includes: 1. Crestor 10 mg p.o. daily. 2. Metoprolol 50 mg daily. 3. Tramadol 50 mg as needed. 4. Aspirin 81 mg daily.  PAST SURGICAL HISTORY:  Includes coronary catheterization and stent in 2001 as well as a prostatectomy in 2007.  DRUG ALLERGIES:  None.  PHYSICAL  EXAMINATION:  GENERAL:  Vincent Brock in the office today. VITAL SIGNS:  He was afebrile with stable vital signs.  His pulse is 58, blood pressure 140/84. EXTREMITIES:  He walks with a genu valgum bilaterally, left greater than right with painful crepitation in the knee.  He has no evidence of any venous lymphatic changes in bilateral lower extremities. HEAD AND NECK EXAMINATION:  Normal.  He does wear hearing aids. RESPIRATORY SYSTEM:  His breathing is normal without wheezing with exertion. CHEST:  Clear to auscultation bilaterally. ABDOMEN:  Soft and nontender.  DIAGNOSTIC STUDIES:  Radiographs of the knees are available through our office indicating advanced left knee osteoarthritis.  These are brought into the operation for the operation.  His preoperative labs and EKG will be performed through the hospital.  ASSESSMENT:  Advanced left knee osteoarthritis, failing conservative measures.  PLAN:  Patient will be admitted for same-day surgery on July 01, 2011. He will undergo left total knee replacement.  Plan is to stay in the hospital for 2 days prior to be transferred to home.  Home medications prescribed other than his pain medicines, which will be given at the time of discharge apart from using Xarelto in the hospital  and then transferred back to aspirin 325 mg b.i.d. for 30 days and then back to his 81 mg dose from there.     Madlyn Frankel Charlann Boxer, M.D.     MDO/MEDQ  D:  06/26/2011  T:  06/26/2011  Job:  [id number]  cc:   Alanson Puls, M.D.  Electronically Signed by Durene Romans M.D. on 07/07/2011 09:39:29 AM

## 2011-07-21 NOTE — Telephone Encounter (Signed)
No note required for this encounter.

## 2011-07-26 NOTE — Discharge Summary (Signed)
NAME:  TRAVER, MECKES NO.:  [id number]  MEDICAL RECORD NO.:  [id number]  LOCATION:  1607                         FACILITY:  [facility]  PHYSICIAN:  Madlyn Frankel. Charlann Boxer, M.D.  DATE OF BIRTH:  06/27/1941  DATE OF ADMISSION:  07/01/2011 DATE OF DISCHARGE:  07/03/2011                              DISCHARGE SUMMARY   PROCEDURE:  Left total knee arthroplasty.  ADMITTING DIAGNOSIS:  Left knee osteoarthritis.  DISCHARGE DIAGNOSES: 1. Status post left total knee arthroplasty. 2. Coronary artery disease with history of stenting. 3. Chronic obstructive pulmonary disease. 4. History of kidney stones and hypercholesterolemia. 5. History of some hearing loss.  HISTORY OF PRESENT ILLNESS:  Vincent Brock is a 70 year old gentleman who has been seen and evaluated and followed in the office for advanced left knee osteoarthritis, confirmed clinically and radiographically.  He has failed conservative measures and injections, therapy, medication, and this is a point, at which he is ready to proceed with more definitive treatment.  Risks, benefits, and expectations of the procedure have been discussed with the patient.  The patient understands the risks, benefits, and expectations, and wishes to proceed with surgery.  He had obtained clearance from Dr. Ellard Artis.  HOSPITAL COURSE:  The patient underwent the above-stated procedure on July 01, 2011, on inpatient basis.  The patient tolerated the procedure well and brought to the recovery room in good condition, and subsequently to the floor.  While in the hospital, postoperative day #1, the patient was doing well.  He did have some complaints with nausea. He is afebrile.  Vital signs stable.  Hematocrit was 34.2.  The patient's Hemovac was taken out.  Pain medications were changed to help with  nausea. On postoperative #2, the patient was doing really well, afebrile.   Vital signs stable.  Hematocrit was stable at 30.4.  Inpatient Ace wrap  was removed.  It was felt that the patient was doing well enough, and the patient was also feeling well enough to be discharged home on July 03, 2011.  DISCHARGE CONDITION:  Good.  DISCHARGE INSTRUCTIONS:  The patient will be discharged home on July 03, 2011.  The patient will follow up with Dr. Charlann Boxer at the [facility] in 2 weeks.  The patient is weightbearing as tolerated.  The patient is to maintain his surgical dressings for 8 days, and at that time he is able to take that off and replace with gauze and tape.  The patient was to keep the area dry and clean.  If the patient does have any problems, he knows to call the [facility].  DISCHARGE MEDICATIONS: 1. Colace 100 mg caplets 1 p.o. b.i.d. p.r.n. constipation. 2. Aspirin enteric-coated 325 mg 1 p.o. b.i.d., take this for 30 days. 3. Norco 7.5/325 mg 1 to 2 p.o. q.4-6 h. p.r.n. pain. 4. MiraLax 17 g 1 p.o. daily or b.i.d. to help with constipation. 5. Robaxin 500 mg 1 p.o. q.6 h. p.r.n. muscle spasms. 6. Crestor 10 mg 1 p.o. q.h.s.7. Coenzyme Q10 400 mg 1 p.o. q.a.m. 8. Fish oil OTC 2-3 capsules p.o. daily. 9. Metoprolol succinate 50 mg 1 p.o. q.a.m. 10.Multivitamin 1 p.o. q.a.m.    ______________________________ Lanney Gins,  PA   ______________________________ Madlyn Frankel Charlann Boxer, M.D.    MB/MEDQ  D:  07/22/2011  T:  07/23/2011  Job:  [id number]  Electronically Signed by Lanney Gins PA on 07/23/2011 08:03:12 PM Electronically Signed by Durene Romans M.D. on 07/26/2011 09:17:09 AM

## 2011-07-30 ENCOUNTER — Ambulatory Visit: Payer: Medicare Other | Attending: Orthopedic Surgery | Admitting: Physical Therapy

## 2011-07-30 DIAGNOSIS — M25569 Pain in unspecified knee: Secondary | ICD-10-CM | POA: Insufficient documentation

## 2011-07-30 DIAGNOSIS — Z96659 Presence of unspecified artificial knee joint: Secondary | ICD-10-CM | POA: Insufficient documentation

## 2011-07-30 DIAGNOSIS — IMO0001 Reserved for inherently not codable concepts without codable children: Secondary | ICD-10-CM | POA: Insufficient documentation

## 2011-07-30 DIAGNOSIS — M25669 Stiffness of unspecified knee, not elsewhere classified: Secondary | ICD-10-CM | POA: Insufficient documentation

## 2011-07-30 DIAGNOSIS — R269 Unspecified abnormalities of gait and mobility: Secondary | ICD-10-CM | POA: Insufficient documentation

## 2011-08-01 ENCOUNTER — Ambulatory Visit: Payer: Medicare Other | Admitting: Physical Therapy

## 2011-08-05 ENCOUNTER — Ambulatory Visit: Payer: Medicare Other | Admitting: Rehabilitation

## 2011-08-07 ENCOUNTER — Ambulatory Visit: Payer: Medicare Other | Admitting: Rehabilitation

## 2011-09-17 ENCOUNTER — Other Ambulatory Visit: Payer: Self-pay | Admitting: Cardiovascular Disease

## 2011-11-18 ENCOUNTER — Ambulatory Visit (INDEPENDENT_AMBULATORY_CARE_PROVIDER_SITE_OTHER): Payer: Medicare Other | Admitting: Ophthalmology

## 2011-11-18 DIAGNOSIS — H33009 Unspecified retinal detachment with retinal break, unspecified eye: Secondary | ICD-10-CM

## 2011-11-18 DIAGNOSIS — H26499 Other secondary cataract, unspecified eye: Secondary | ICD-10-CM

## 2011-11-18 DIAGNOSIS — H43819 Vitreous degeneration, unspecified eye: Secondary | ICD-10-CM

## 2011-11-19 ENCOUNTER — Encounter (INDEPENDENT_AMBULATORY_CARE_PROVIDER_SITE_OTHER): Payer: Medicare Other | Admitting: Ophthalmology

## 2011-11-19 DIAGNOSIS — H2700 Aphakia, unspecified eye: Secondary | ICD-10-CM

## 2011-12-02 ENCOUNTER — Ambulatory Visit (INDEPENDENT_AMBULATORY_CARE_PROVIDER_SITE_OTHER): Payer: Medicare Other | Admitting: Ophthalmology

## 2012-01-23 ENCOUNTER — Ambulatory Visit: Payer: Medicare Other | Admitting: Cardiovascular Disease

## 2012-01-29 ENCOUNTER — Other Ambulatory Visit: Payer: Medicare Other | Admitting: *Deleted

## 2012-01-29 ENCOUNTER — Ambulatory Visit (INDEPENDENT_AMBULATORY_CARE_PROVIDER_SITE_OTHER): Payer: Medicare Other | Admitting: Cardiovascular Disease

## 2012-01-29 ENCOUNTER — Encounter: Payer: Self-pay | Admitting: Cardiovascular Disease

## 2012-01-29 VITALS — BP 143/100 | HR 67 | Ht 73.0 in | Wt 194.4 lb

## 2012-01-29 DIAGNOSIS — E785 Hyperlipidemia, unspecified: Secondary | ICD-10-CM

## 2012-01-29 DIAGNOSIS — I2510 Atherosclerotic heart disease of native coronary artery without angina pectoris: Secondary | ICD-10-CM

## 2012-01-29 LAB — LIPID PANEL
Cholesterol: 130 mg/dL (ref 0–200)
HDL: 54.1 mg/dL (ref >39.00)
LDL Cholesterol: 67 mg/dL (ref 0–99)
Total CHOL/HDL Ratio: 2
Triglycerides: 44 mg/dL (ref 0.0–149.0)
VLDL: 8.8 mg/dL (ref 0.0–40.0)

## 2012-01-29 LAB — BASIC METABOLIC PANEL
BUN: 16 mg/dL (ref 6–23)
CO2: 30 mEq/L (ref 19–32)
Calcium: 9 mg/dL (ref 8.4–10.5)
Chloride: 108 mEq/L (ref 96–112)
Creatinine, Ser: 0.9 mg/dL (ref 0.4–1.5)
GFR: 89.86 mL/min (ref >60.00)
Glucose, Bld: 103 mg/dL — ABNORMAL HIGH (ref 70–99)
Potassium: 4 mEq/L (ref 3.5–5.1)
Sodium: 143 mEq/L (ref 135–145)

## 2012-01-29 LAB — HEPATIC FUNCTION PANEL
ALT: 19 U/L (ref 0–53)
AST: 24 U/L (ref 0–37)
Albumin: 4.1 g/dL (ref 3.5–5.2)
Alkaline Phosphatase: 75 U/L (ref 39–117)
Bilirubin, Direct: 0 mg/dL (ref 0.0–0.3)
Total Bilirubin: 0.6 mg/dL (ref 0.3–1.2)
Total Protein: 7 g/dL (ref 6.0–8.3)

## 2012-01-29 NOTE — Assessment & Plan Note (Signed)
Mr. Rishel is doing well.   We will continue with current meds.

## 2012-01-29 NOTE — Patient Instructions (Signed)
Your physician recommends that you schedule a follow-up appointment in: 3 months   Your physician recommends that you return for a FASTING lipid profile: today

## 2012-01-29 NOTE — Progress Notes (Signed)
Vincent Brock Date of Birth  February 07, 1941 [facility]     [place] [facility]  [address]    [address]   [address] [place], [place]  [address]    [place], [place]  [address] [phone]  Fax  [phone]  [phone]  Fax [phone]  Problem list:  1. Coronary artery disease: Status post PTCA and stenting of the RCA in 2000. Who placed a 4.0 x 18 mm NIR Royal post dilated up to 4.4 mm 2. Hyperlipidemia 3. Hypertension 4. Prostate cancer  History of Present Illness:  71 yo with cad.  No chest pain.  Works out every day.  Had knee surgery several months ago.    Current Outpatient Prescriptions on File Prior to Visit  Medication Sig Dispense Refill  . aspirin 81 MG EC tablet Take 81 mg by mouth daily.        . CRESTOR 20 MG tablet Take 1 tablet by mouth Daily.      . metoprolol (LOPRESSOR) 100 MG tablet take 1/2 tablet by mouth twice a day  60 tablet  6  . nitroGLYCERIN (NITROSTAT) 0.4 MG SL tablet One tablet under tongue up to three times if pain continues after 3 doses call 911  25 tablet  3    No Known Allergies  Past Medical History  Diagnosis Date  . Hyperlipidemia   . Hypertension   . Coronary artery disease     status post PTCA and stenting of his right coronary artery in 2000  . History of prostate cancer   . 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 History of myocardial infarction 07/15/1999    cardiac catherization revealed a tight 99% stenosis in mid right coronary artery -- with successful PTCA and stenting of mid right    Past Surgical History  Procedure Date  . Robot assisted laparoscopic radical prostatectomy 07/06/2006    Clinical stage T1C adenocarcinoma of the prostate  . Umbilical hernia repair 10/17/2004  . Retinal detachment repair w/ scleral buckle le 06/09/02    Retinal detachment, right eye  . Coronary angioplasty with stent placement 2000    stenting of RCA  . Appendectomy     History    Smoking status  . Former Smoker  . Quit date: 10/22/2005  Smokeless tobacco  . Not on file    History  Alcohol Use     Family History  Problem Relation Age of Onset  . Heart attack Father 28  . Cirrhosis Mother   . Coronary artery disease Sister     with stents  . Tuberculosis Father   . Heart failure Father     Reviw of Systems:  Reviewed in the HPI.  All other systems are negative.  Physical Exam: Blood pressure 143/100, pulse 67, height 6\' 1"  (1.854 m), weight 194 lb 6.4 oz (88.179 kg). General: Well developed, well nourished, in no acute distress.  Head: Normocephalic, atraumatic, sclera non-icteric, mucus membranes are moist,   Neck: Supple. Negative for carotid bruits. JVD not elevated.  Lungs: Clear bilaterally to auscultation without wheezes, rales, or rhonchi. Breathing is unlabored.  Heart: RRR with S1 S2. No murmurs, rubs, or gallops appreciated.  Abdomen: Soft, non-tender, non-distended with normoactive bowel sounds. No hepatomegaly. No rebound/guarding. No obvious abdominal masses.  Msk:  Strength and tone appear normal for age.  Extremities: No clubbing or cyanosis. No edema.  Distal pedal pulses are 2+ and equal bilaterally.  Neuro:  Alert and oriented X 3. Moves all extremities spontaneously.  Psych:  Responds to questions appropriately with a normal affect.  ECG: NSR with 1st degree block  Assessment / Plan:

## 2012-03-18 ENCOUNTER — Other Ambulatory Visit: Payer: Self-pay | Admitting: Cardiovascular Disease

## 2012-04-27 ENCOUNTER — Ambulatory Visit: Payer: Medicare Other | Admitting: Cardiovascular Disease

## 2012-11-15 ENCOUNTER — Other Ambulatory Visit: Payer: Self-pay | Admitting: *Deleted

## 2012-11-15 MED ORDER — METOPROLOL TARTRATE 100 MG PO TABS: 50.0000 mg | ORAL_TABLET | Freq: Two times a day (BID) | ORAL | Status: DC

## 2012-11-15 NOTE — Telephone Encounter (Signed)
Pt needs appointment then refill can be made Fax Received. Refill Completed. [name] (R.M.A)   

## 2012-11-22 ENCOUNTER — Ambulatory Visit (INDEPENDENT_AMBULATORY_CARE_PROVIDER_SITE_OTHER): Payer: Medicare Other | Admitting: Ophthalmology

## 2012-11-22 DIAGNOSIS — H33009 Unspecified retinal detachment with retinal break, unspecified eye: Secondary | ICD-10-CM

## 2012-11-22 DIAGNOSIS — H43819 Vitreous degeneration, unspecified eye: Secondary | ICD-10-CM

## 2013-05-13 ENCOUNTER — Other Ambulatory Visit: Payer: Self-pay | Admitting: *Deleted

## 2013-05-13 MED ORDER — ROSUVASTATIN CALCIUM 20 MG PO TABS: 20.0000 mg | ORAL_TABLET | Freq: Every day | ORAL | Status: DC

## 2013-05-13 NOTE — Telephone Encounter (Signed)
Fax Received. Refill Completed. [name] (R.M.A)  NEED APPOINTMENT 

## 2013-08-25 ENCOUNTER — Ambulatory Visit: Payer: Medicare Other | Admitting: Cardiovascular Disease

## 2013-09-02 ENCOUNTER — Ambulatory Visit (INDEPENDENT_AMBULATORY_CARE_PROVIDER_SITE_OTHER): Payer: Medicare Other | Admitting: Cardiovascular Disease

## 2013-09-02 ENCOUNTER — Encounter: Payer: Self-pay | Admitting: Cardiovascular Disease

## 2013-09-02 VITALS — BP 125/90 | HR 70 | Ht 73.0 in | Wt 193.0 lb

## 2013-09-02 DIAGNOSIS — I2510 Atherosclerotic heart disease of native coronary artery without angina pectoris: Secondary | ICD-10-CM

## 2013-09-02 DIAGNOSIS — E785 Hyperlipidemia, unspecified: Secondary | ICD-10-CM

## 2013-09-02 DIAGNOSIS — I10 Essential (primary) hypertension: Secondary | ICD-10-CM

## 2013-09-02 MED ORDER — ATORVASTATIN CALCIUM 80 MG PO TABS: 80.0000 mg | ORAL_TABLET | Freq: Every day | ORAL | Status: DC

## 2013-09-02 NOTE — Patient Instructions (Addendum)
Your physician has recommended you make the following change in your medication:  Stop crestor due to cost Start lipitor 80 mg daily  Your physician recommends that you return for a FASTING lipid profile: 3 months   Your physician wants you to follow-up in: 1 year  You will receive a reminder letter in the mail two months in advance. If you don't receive a letter, please call our office to schedule the follow-up appointment.   REDUCE HIGH SODIUM FOODS LIKE CANNED SOUP, GRAVY, SAUCES, READY PREPARED FOODS LIKE FROZEN FOODS; LEAN CUISINE, LASAGNA. BACON, SAUSAGE, LUNCH MEAT, FAST FOODS, HOT DOGS, CHIPS, PIZZA, CHINESE FOOD, SOY SAUCE, STORE BOUGHT FRIED CHICKEN= [place] FRIED CHICKEN/ BO JANGLES.

## 2013-09-02 NOTE — Assessment & Plan Note (Signed)
He'll remain stable. He's not had any episodes of angina. Continue with his current medications.

## 2013-09-02 NOTE — Progress Notes (Signed)
Vincent Brock Date of Birth  10-31-1941 [facility]      Office  [address]    [address]   [address] [place], [place]  [address]    [place], [place]  [address] [phone]  Fax  [phone]  [phone]  Fax [phone]  Problem list:  1. Coronary artery disease: Status post PTCA and stenting of the RCA in 2000. Who placed a 4.0 x 18 mm NIR Royal post dilated up to 4.4 mm 2. Hyperlipidemia 3. Hypertension 4. Prostate cancer  History of Present Illness:  72 yo with cad.  No chest pain.  Works out every day.  Had knee surgery several months ago.    Sept. 12, 2014:  No angina.  Has had some BP issues - up and down.   Typical readings are 130 / 85.  Lots of anxiety at night.   He has been [facility] to the [facility].    Current Outpatient Prescriptions on File Prior to Visit  Medication Sig Dispense Refill  . aspirin 81 MG EC tablet Take 81 mg by mouth daily.        . Coenzyme Q10 (CO Q 10 PO) Take by mouth daily.      . metoprolol (LOPRESSOR) 100 MG tablet Take 0.5 tablets (50 mg total) by mouth 2 (two) times daily.  60 tablet  3  . nitroGLYCERIN (NITROSTAT) 0.4 MG SL tablet One tablet under tongue up to three times if pain continues after 3 doses call 911  25 tablet  3  . Omega-3 Fatty Acids (FISH OIL PO) Take by mouth. (Total 5,000)      . rosuvastatin (CRESTOR) 20 MG tablet Take 1 tablet (20 mg total) by mouth daily.  30 tablet  1   No current facility-administered medications on file prior to visit.    No Known Allergies  Past Medical History  Diagnosis Date  . Hyperlipidemia   . Hypertension   . Coronary artery disease     status post PTCA and stenting of his right coronary artery in 2000  . History of prostate cancer   . 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 History of myocardial infarction 07/15/1999    cardiac catherization revealed a tight 99% stenosis in mid right coronary artery -- with successful PTCA  and stenting of mid right    Past Surgical History  Procedure Laterality Date  . Robot assisted laparoscopic radical prostatectomy  07/06/2006    Clinical stage T1C adenocarcinoma of the prostate  . Umbilical hernia repair  10/17/2004  . Retinal detachment repair w/ scleral buckle le  06/09/02    Retinal detachment, right eye  . Coronary angioplasty with stent placement  2000    stenting of RCA  . Appendectomy      History  Smoking status  . Former Smoker  . Quit date: 10/22/2005  Smokeless tobacco  . Not on file    History  Alcohol Use     Family History  Problem Relation Age of Onset  . Heart attack Father 77  . Cirrhosis Mother   . Coronary artery disease Sister     with stents  . Tuberculosis Father   . Heart failure Father     Reviw of Systems:  Reviewed in the HPI.  All other systems are negative.  Physical Exam: Blood pressure 138/102, pulse 70, height 6\' 1"  (1.854 m), weight 193 lb (87.544 kg), SpO2 95.00%. General: Well  developed, well nourished, in no acute distress.  Head: Normocephalic, atraumatic, sclera non-icteric, mucus membranes are moist,   Neck: Supple. Negative for carotid bruits. JVD not elevated.  Lungs: Clear bilaterally to auscultation without wheezes, rales, or rhonchi. Breathing is unlabored.  Heart: RRR with S1 S2. No murmurs, rubs, or gallops appreciated.  Abdomen: Soft, non-tender, non-distended with normoactive bowel sounds. No hepatomegaly. No rebound/guarding. No obvious abdominal masses.  Msk:  Strength and tone appear normal for age.  Extremities: No clubbing or cyanosis. No edema.  Distal pedal pulses are 2+ and equal bilaterally.  Neuro: Alert and oriented X 3. Moves all extremities spontaneously.  Psych:  Responds to questions appropriately with a normal affect.  ECG: NSR with 1st degree block  Assessment / Plan:

## 2013-09-02 NOTE — Assessment & Plan Note (Signed)
He complains of the Crestor being too expensive.. We will discontinue the Crestor start him on Lipitor 80 mg a day. We'll repeat fasting labs in 3 months.

## 2013-09-02 NOTE — Assessment & Plan Note (Signed)
His blood pressure is mildly elevated. We reach recheck today his diastolic still is at the upper limits of normal. I've encouraged him to continue to work on a good diet and exercise program. He needs to continue to watch his salt.

## 2013-10-24 ENCOUNTER — Other Ambulatory Visit: Payer: Self-pay | Admitting: *Deleted

## 2013-10-24 MED ORDER — METOPROLOL TARTRATE 100 MG PO TABS: 50.0000 mg | ORAL_TABLET | Freq: Two times a day (BID) | ORAL | Status: DC

## 2013-11-22 ENCOUNTER — Ambulatory Visit (INDEPENDENT_AMBULATORY_CARE_PROVIDER_SITE_OTHER): Payer: Medicare Other | Admitting: Ophthalmology

## 2013-11-22 DIAGNOSIS — H43819 Vitreous degeneration, unspecified eye: Secondary | ICD-10-CM

## 2013-11-22 DIAGNOSIS — H33009 Unspecified retinal detachment with retinal break, unspecified eye: Secondary | ICD-10-CM

## 2013-12-02 ENCOUNTER — Other Ambulatory Visit (INDEPENDENT_AMBULATORY_CARE_PROVIDER_SITE_OTHER): Payer: Medicare Other

## 2013-12-02 DIAGNOSIS — I10 Essential (primary) hypertension: Secondary | ICD-10-CM

## 2013-12-02 DIAGNOSIS — I2510 Atherosclerotic heart disease of native coronary artery without angina pectoris: Secondary | ICD-10-CM

## 2013-12-02 DIAGNOSIS — E785 Hyperlipidemia, unspecified: Secondary | ICD-10-CM

## 2013-12-02 LAB — BASIC METABOLIC PANEL
BUN: 13 mg/dL (ref 6–23)
Chloride: 108 mEq/L (ref 96–112)
Glucose, Bld: 91 mg/dL (ref 70–99)
Potassium: 4 mEq/L (ref 3.5–5.1)
Sodium: 141 mEq/L (ref 135–145)

## 2013-12-02 LAB — LIPID PANEL
Cholesterol: 114 mg/dL (ref 0–200)
HDL: 47.8 mg/dL (ref >39.00)
LDL Cholesterol: 56 mg/dL (ref 0–99)
VLDL: 10.6 mg/dL (ref 0.0–40.0)

## 2013-12-02 LAB — HEPATIC FUNCTION PANEL
Alkaline Phosphatase: 87 U/L (ref 39–117)
Bilirubin, Direct: 0.1 mg/dL (ref 0.0–0.3)
Total Bilirubin: 0.8 mg/dL (ref 0.3–1.2)
Total Protein: 6.9 g/dL (ref 6.0–8.3)

## 2014-02-20 ENCOUNTER — Other Ambulatory Visit: Payer: Self-pay | Admitting: Cardiovascular Disease

## 2014-02-21 ENCOUNTER — Other Ambulatory Visit: Payer: Self-pay

## 2014-02-21 MED ORDER — METOPROLOL TARTRATE 100 MG PO TABS: 50.0000 mg | ORAL_TABLET | Freq: Two times a day (BID) | ORAL | Status: DC

## 2014-08-21 ENCOUNTER — Encounter: Payer: Self-pay | Admitting: Cardiovascular Disease

## 2014-08-31 ENCOUNTER — Ambulatory Visit (INDEPENDENT_AMBULATORY_CARE_PROVIDER_SITE_OTHER): Payer: Managed Care, Other (non HMO) | Admitting: *Deleted

## 2014-08-31 DIAGNOSIS — E785 Hyperlipidemia, unspecified: Secondary | ICD-10-CM

## 2014-08-31 DIAGNOSIS — I10 Essential (primary) hypertension: Secondary | ICD-10-CM

## 2014-08-31 LAB — LIPID PANEL
CHOL/HDL RATIO: 3
Cholesterol: 111 mg/dL (ref 0–200)
HDL: 39.8 mg/dL (ref >39.00)
LDL Cholesterol: 57 mg/dL (ref 0–99)
NonHDL: 71.2
TRIGLYCERIDES: 72 mg/dL (ref 0.0–149.0)
VLDL: 14.4 mg/dL (ref 0.0–40.0)

## 2014-08-31 LAB — CBC WITH DIFFERENTIAL/PLATELET
BASOS ABS: 0 10*3/uL (ref 0.0–0.1)
Basophils Relative: 0.3 % (ref 0.0–3.0)
Eosinophils Absolute: 0.1 10*3/uL (ref 0.0–0.7)
Eosinophils Relative: 2.9 % (ref 0.0–5.0)
HCT: 43.3 % (ref 39.0–52.0)
HEMOGLOBIN: 14.4 g/dL (ref 13.0–17.0)
LYMPHS PCT: 30.3 % (ref 12.0–46.0)
Lymphs Abs: 1.4 10*3/uL (ref 0.7–4.0)
MCHC: 33.2 g/dL (ref 30.0–36.0)
MCV: 92.9 fl (ref 78.0–100.0)
MONOS PCT: 10.5 % (ref 3.0–12.0)
Monocytes Absolute: 0.5 10*3/uL (ref 0.1–1.0)
NEUTROS ABS: 2.6 10*3/uL (ref 1.4–7.7)
NEUTROS PCT: 56 % (ref 43.0–77.0)
Platelets: 183 10*3/uL (ref 150.0–400.0)
RBC: 4.66 Mil/uL (ref 4.22–5.81)
RDW: 14.6 % (ref 11.5–15.5)
WBC: 4.6 10*3/uL (ref 4.0–10.5)

## 2014-08-31 LAB — BASIC METABOLIC PANEL
BUN: 16 mg/dL (ref 6–23)
CALCIUM: 8.9 mg/dL (ref 8.4–10.5)
CO2: 27 mEq/L (ref 19–32)
Chloride: 108 mEq/L (ref 96–112)
Creatinine, Ser: 0.8 mg/dL (ref 0.4–1.5)
GFR: 96.68 mL/min (ref >60.00)
Glucose, Bld: 95 mg/dL (ref 70–99)
Potassium: 4 mEq/L (ref 3.5–5.1)
SODIUM: 140 meq/L (ref 135–145)

## 2014-08-31 LAB — HEPATIC FUNCTION PANEL
ALBUMIN: 3.9 g/dL (ref 3.5–5.2)
ALK PHOS: 83 U/L (ref 39–117)
ALT: 19 U/L (ref 0–53)
AST: 23 U/L (ref 0–37)
BILIRUBIN DIRECT: 0 mg/dL (ref 0.0–0.3)
Total Bilirubin: 0.6 mg/dL (ref 0.2–1.2)
Total Protein: 6.8 g/dL (ref 6.0–8.3)

## 2014-09-06 ENCOUNTER — Other Ambulatory Visit: Payer: Self-pay | Admitting: Cardiovascular Disease

## 2014-09-07 ENCOUNTER — Encounter: Payer: Self-pay | Admitting: Cardiovascular Disease

## 2014-09-07 ENCOUNTER — Ambulatory Visit (INDEPENDENT_AMBULATORY_CARE_PROVIDER_SITE_OTHER): Payer: Managed Care, Other (non HMO) | Admitting: Cardiovascular Disease

## 2014-09-07 VITALS — BP 110/72 | HR 62 | Ht 73.0 in | Wt 197.0 lb

## 2014-09-07 DIAGNOSIS — E785 Hyperlipidemia, unspecified: Secondary | ICD-10-CM

## 2014-09-07 DIAGNOSIS — I10 Essential (primary) hypertension: Secondary | ICD-10-CM

## 2014-09-07 DIAGNOSIS — I2510 Atherosclerotic heart disease of native coronary artery without angina pectoris: Secondary | ICD-10-CM

## 2014-09-07 MED ORDER — ATORVASTATIN CALCIUM 80 MG PO TABS: 80.0000 mg | ORAL_TABLET | Freq: Every day | ORAL | Status: DC

## 2014-09-07 MED ORDER — METOPROLOL TARTRATE 50 MG PO TABS: 25.0000 mg | ORAL_TABLET | Freq: Two times a day (BID) | ORAL | Status: DC

## 2014-09-07 NOTE — Assessment & Plan Note (Signed)
His lipids look ok Will check again in 1 year

## 2014-09-07 NOTE — Progress Notes (Signed)
[name] Date of Birth  [birth year] Vincent Brock [address]    [place]   [place], [place]  [address]    [place], [place]  [address] [phone]  Fax  [phone]  [phone]  Fax [phone]  Problem list:  1. Coronary artery disease: Status post PTCA and stenting of the RCA in 2000. Who placed a 4.0 x 18 mm NIR Royal post dilated up to 4.4 mm 2. Hyperlipidemia 3. Hypertension 4. Prostate cancer  History of Present Illness:  73 yo with cad.  No chest pain.  Works out every day.  Had knee surgery several months ago.    Sept. 12, 2014:  No angina.  Has had some BP issues - up and down.   Typical readings are 130 / 85.  Lots of anxiety at night.   He has been going to the [facility].    Sept. 17, 2015:  Doing well.  No dyspnea, no CP,  Fatigues eaily.   Working out at [employer].    Current Outpatient Prescriptions on File Prior to Visit  Medication Sig Dispense Refill  . aspirin 81 MG EC tablet Take 81 mg by mouth daily.        Marland Kitchen atorvastatin (LIPITOR) 80 MG tablet TAKE ONE TABLET BY MOUTH ONCE DAILY  90 tablet  0  . Coenzyme Q10 (CO Q 10 PO) Take by mouth daily.      . metoprolol (LOPRESSOR) 100 MG tablet Take 0.5 tablets (50 mg total) by mouth 2 (two) times daily.  30 tablet  6  . nitroGLYCERIN (NITROSTAT) 0.4 MG SL tablet One tablet under tongue up to three times if pain continues after 3 doses call 911  25 tablet  3  . Omega-3 Fatty Acids (FISH OIL PO) Take by mouth. (Total 5,000)       No current facility-administered medications on file prior to visit.    No Known Allergies  Past Medical History  Diagnosis Date  . Hyperlipidemia   . Hypertension   . Coronary artery disease     status post PTCA and stenting of his right coronary artery in 2000  . History of prostate cancer   . 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 History of myocardial infarction 07/15/1999    cardiac  catherization revealed a tight 99% stenosis in mid right coronary artery -- with successful PTCA and stenting of mid right    Past Surgical History  Procedure Laterality Date  . Robot assisted laparoscopic radical prostatectomy  07/06/2006    Clinical stage T1C adenocarcinoma of the prostate  . Umbilical hernia repair  10/17/2004  . Retinal detachment repair w/ scleral buckle le  06/09/02    Retinal detachment, right eye  . Coronary angioplasty with stent placement  2000    stenting of RCA  . Appendectomy      History  Smoking status  . Former Smoker  . Quit date: 10/22/2005  Smokeless tobacco  . Not on file    History  Alcohol Use     Family History  Problem Relation Age of Onset  . Heart attack Father 24  . Cirrhosis Mother   . Coronary artery disease Sister     with stents  . Tuberculosis Father   . Heart failure Father     Reviw of Systems:  Reviewed in the HPI.  All other systems are negative.  Physical Exam: Blood pressure 110/72, pulse 62, height 6\' 1"  (1.854 m), weight 197 lb (89.359 kg). General: Well developed, well nourished, in no acute distress.  Head: Normocephalic, atraumatic, sclera non-icteric, mucus membranes are moist,   Neck: Supple. Negative for carotid bruits. JVD not elevated.  Lungs: Clear bilaterally to auscultation without wheezes, rales, or rhonchi. Breathing is unlabored.  Heart: RRR with S1 S2. No murmurs, rubs, or gallops appreciated.  Abdomen: Soft, non-tender, non-distended with normoactive bowel sounds. No hepatomegaly. No rebound/guarding. No obvious abdominal masses.  Msk:  Strength and tone appear normal for age.  Extremities: No clubbing or cyanosis. No edema.  Distal pedal pulses are 2+ and equal bilaterally.  Neuro: Alert and oriented X 3. Moves all extremities spontaneously.  Psych:  Responds to questions appropriately with a normal affect.  ECG: Sept. 17, 2015:  NSR at 66 with 1st degree AV block   Assessment /  Plan:

## 2014-09-07 NOTE — Assessment & Plan Note (Addendum)
Vincent Brock is doing OK.  No CP.  No dyspnea. Lipids look great. Continue diet, exercise and weight loss plan.  Will see him in 1 year with fasting lipids.   Will change his metoprolol to 25 BID.

## 2014-09-07 NOTE — Patient Instructions (Signed)
Your physician has recommended you make the following change in your medication:  DECREASE Metoprolol to 25 mg twice daily  Your physician wants you to follow-up in: 1 year with Dr. Acie Fredrickson.  You will receive a reminder letter in the mail two months in advance. If you don't receive a letter, please call our office to schedule the follow-up appointment. Your physician recommends that you return for lab work in: 12 months on the day of or a few days before your office visit with Dr. Acie Fredrickson.  You will need to FAST for this appointment - nothing to eat or drink after midnight the night before except water.

## 2014-11-22 ENCOUNTER — Ambulatory Visit (INDEPENDENT_AMBULATORY_CARE_PROVIDER_SITE_OTHER): Payer: Medicare Other | Admitting: Ophthalmology

## 2015-09-14 ENCOUNTER — Other Ambulatory Visit (INDEPENDENT_AMBULATORY_CARE_PROVIDER_SITE_OTHER): Payer: Medicare HMO | Admitting: *Deleted

## 2015-09-14 DIAGNOSIS — I10 Essential (primary) hypertension: Secondary | ICD-10-CM | POA: Diagnosis not present

## 2015-09-14 LAB — HEPATIC FUNCTION PANEL
ALBUMIN: 4.1 g/dL (ref 3.5–5.2)
ALT: 18 U/L (ref 0–53)
AST: 19 U/L (ref 0–37)
Alkaline Phosphatase: 85 U/L (ref 39–117)
BILIRUBIN TOTAL: 0.7 mg/dL (ref 0.2–1.2)
Bilirubin, Direct: 0.1 mg/dL (ref 0.0–0.3)
Total Protein: 6.6 g/dL (ref 6.0–8.3)

## 2015-09-14 LAB — LIPID PANEL
CHOLESTEROL: 112 mg/dL (ref 0–200)
HDL: 48.9 mg/dL (ref >39.00)
LDL Cholesterol: 51 mg/dL (ref 0–99)
NonHDL: 62.67
Total CHOL/HDL Ratio: 2
Triglycerides: 57 mg/dL (ref 0.0–149.0)
VLDL: 11.4 mg/dL (ref 0.0–40.0)

## 2015-09-14 LAB — BASIC METABOLIC PANEL
BUN: 14 mg/dL (ref 6–23)
CO2: 29 mEq/L (ref 19–32)
Calcium: 9.1 mg/dL (ref 8.4–10.5)
Chloride: 108 mEq/L (ref 96–112)
Creatinine, Ser: 0.83 mg/dL (ref 0.40–1.50)
GFR: 96.4 mL/min (ref >60.00)
Glucose, Bld: 91 mg/dL (ref 70–99)
POTASSIUM: 4.3 meq/L (ref 3.5–5.1)
SODIUM: 144 meq/L (ref 135–145)

## 2015-09-14 NOTE — Addendum Note (Signed)
Addended by: Domenica Reamer R on: 09/14/2015 09:26 AM   Modules accepted: Orders

## 2015-09-18 ENCOUNTER — Encounter: Payer: Self-pay | Admitting: Cardiovascular Disease

## 2015-09-18 ENCOUNTER — Other Ambulatory Visit: Payer: Self-pay | Admitting: *Deleted

## 2015-09-18 ENCOUNTER — Ambulatory Visit (INDEPENDENT_AMBULATORY_CARE_PROVIDER_SITE_OTHER): Payer: Medicare HMO | Admitting: Cardiovascular Disease

## 2015-09-18 VITALS — BP 140/90 | HR 53 | Ht 73.0 in | Wt 186.6 lb

## 2015-09-18 DIAGNOSIS — E785 Hyperlipidemia, unspecified: Secondary | ICD-10-CM | POA: Diagnosis not present

## 2015-09-18 DIAGNOSIS — I10 Essential (primary) hypertension: Secondary | ICD-10-CM

## 2015-09-18 DIAGNOSIS — I2510 Atherosclerotic heart disease of native coronary artery without angina pectoris: Secondary | ICD-10-CM | POA: Diagnosis not present

## 2015-09-18 MED ORDER — METOPROLOL TARTRATE 50 MG PO TABS: 25.0000 mg | ORAL_TABLET | Freq: Two times a day (BID) | ORAL | Status: DC

## 2015-09-18 NOTE — Progress Notes (Signed)
[name] Date of Birth  1941-01-29 [place] [address]    [place]   [place], [place]  [address]    [place], [place]  [address] [phone]  Fax  [phone]  [phone]  Fax [phone]  Problem list:  1. Coronary artery disease: Status post PTCA and stenting of the RCA in 2000. Who placed a 4.0 x 18 mm NIR Royal post dilated up to 4.4 mm 2. Hyperlipidemia 3. Hypertension 4. Prostate cancer  History of Present Illness:  74 yo with cad.  No chest pain.  Works out every day.  Had knee surgery several months ago.    Sept. 12, 2014:  No angina.  Has had some BP issues - up and down.   Typical readings are 130 / 85.  Lots of anxiety at night.   He has been going to the [facility] [place].    Sept. 17, 2015:  Doing well.  No dyspnea, no CP,  Fatigues eaily.   Working out at [employer].  Sept. 27, 2016:  Doing well.  No CP ,  BP is a bit higher today . Typically is i the normal range.   Current Outpatient Prescriptions on File Prior to Visit  Medication Sig Dispense Refill  . aspirin 81 MG EC tablet Take 81 mg by mouth daily.      Marland Kitchen atorvastatin (LIPITOR) 80 MG tablet Take 1 tablet (80 mg total) by mouth daily at 6 PM. 90 tablet 3  . Coenzyme Q10 (CO Q 10 PO) Take by mouth daily.    . metoprolol (LOPRESSOR) 50 MG tablet Take 0.5 tablets (25 mg total) by mouth 2 (two) times daily. 90 tablet 3  . nitroGLYCERIN (NITROSTAT) 0.4 MG SL tablet One tablet under tongue up to three times if pain continues after 3 doses call 911 25 tablet 3  . Omega-3 Fatty Acids (FISH OIL PO) Take by mouth daily. (Total 5,000)     No current facility-administered medications on file prior to visit.    No Known Allergies  Past Medical History  Diagnosis Date  . Hyperlipidemia   . Hypertension   . Coronary artery disease     status post PTCA and stenting of his right coronary artery in 2000  . History of prostate cancer   . Adenocarcinoma  of prostate     Clinical stage T1C adenocarcinoma  . History of myocardial infarction 07/15/1999    cardiac catherization revealed a tight 99% stenosis in mid right coronary artery -- with successful PTCA and stenting of mid right    Past Surgical History  Procedure Laterality Date  . Robot assisted laparoscopic radical prostatectomy  07/06/2006    Clinical stage T1C adenocarcinoma of the prostate  . Umbilical hernia repair  10/17/2004  . Retinal detachment repair w/ scleral buckle le  06/09/02    Retinal detachment, right eye  . Coronary angioplasty with stent placement  2000    stenting of RCA  . Appendectomy      History  Smoking status  . Former Smoker  . Quit date: 10/22/2005  Smokeless tobacco  . Not on file    History  Alcohol Use: Not on file    Family History  Problem Relation Age of Onset  . Heart attack Father 77  . Cirrhosis Mother   . Coronary artery disease Sister     with stents  . Tuberculosis Father   . Heart  failure Father     Reviw of Systems:  Reviewed in the HPI.  All other systems are negative.  Physical Exam: Blood pressure 140/90, pulse 53, height 6\' 1"  (1.854 m), weight 84.641 kg (186 lb 9.6 oz). General: Well developed, well nourished, in no acute distress.  Head: Normocephalic, atraumatic, sclera non-icteric, mucus membranes are moist,   Neck: Supple. Negative for carotid bruits. JVD not elevated.  Lungs: Clear bilaterally to auscultation without wheezes, rales, or rhonchi. Breathing is unlabored.  Heart: RRR with S1 S2. No murmurs, rubs, or gallops appreciated.  Abdomen: Soft, non-tender, non-distended with normoactive bowel sounds. No hepatomegaly. No rebound/guarding. No obvious abdominal masses.  Msk:  Strength and tone appear normal for age.  Extremities: No clubbing or cyanosis. No edema.  Distal pedal pulses are 2+ and equal bilaterally.  Neuro: Alert and oriented X 3. Moves all extremities spontaneously.  Psych:   Responds to questions appropriately with a normal affect.  ECG: Sept. 27, 2016:  Sinus brady at 43 with 1st degree AV block   Assessment / Plan:   1. Coronary artery disease: Status post PTCA and stenting of the RCA in 2000. Who placed a 4.0 x 18 mm NIR Royal post dilated up to 4.4 mm 2. Hyperlipidemia - lipids look great. Continue current meds.   3. Hypertension- his blood pressure is well-controlled. Continue current medications.  4. Prostate cancer    Nahser, Wonda Cheng, MD  09/18/2015 10:42 AM    [facility] [place],  [place], [place]  [address] Pager [phone] Phone: [phone]; Fax: [phone]   [facility]  [address] [place], [place]  [address] [phone]   Fax [phone]

## 2015-09-18 NOTE — Patient Instructions (Signed)
Medication Instructions:  Your physician recommends that you continue on your current medications as directed. Please refer to the Current Medication list given to you today.   Labwork: Your physician recommends that you return for lab work in: 1 year on the day of or a few days before your office visit with Dr. Nahser.  You will need to FAST for this appointment - nothing to eat or drink after midnight the night before except water.    Testing/Procedures: None Ordered   Follow-Up: Your physician wants you to follow-up in: 1 year with Dr. Nahser.  You will receive a reminder letter in the mail two months in advance. If you don't receive a letter, please call our office to schedule the follow-up appointment.    

## 2015-11-28 ENCOUNTER — Other Ambulatory Visit: Payer: Self-pay | Admitting: Cardiovascular Disease

## 2016-02-18 DIAGNOSIS — H01023 Squamous blepharitis right eye, unspecified eyelid: Secondary | ICD-10-CM | POA: Diagnosis not present

## 2016-02-18 DIAGNOSIS — H04123 Dry eye syndrome of bilateral lacrimal glands: Secondary | ICD-10-CM | POA: Diagnosis not present

## 2016-02-18 DIAGNOSIS — H01026 Squamous blepharitis left eye, unspecified eyelid: Secondary | ICD-10-CM | POA: Diagnosis not present

## 2016-03-20 ENCOUNTER — Telehealth: Payer: Self-pay

## 2016-03-20 NOTE — Telephone Encounter (Signed)
Pre-Visit Call completed. 

## 2016-03-21 ENCOUNTER — Encounter: Payer: Self-pay | Admitting: Family Medicine

## 2016-03-21 ENCOUNTER — Ambulatory Visit (INDEPENDENT_AMBULATORY_CARE_PROVIDER_SITE_OTHER): Payer: PPO | Admitting: Family Medicine

## 2016-03-21 VITALS — BP 108/70 | HR 70 | Temp 98.1°F | Ht 73.0 in | Wt 194.4 lb

## 2016-03-21 DIAGNOSIS — I10 Essential (primary) hypertension: Secondary | ICD-10-CM | POA: Diagnosis not present

## 2016-03-21 DIAGNOSIS — E785 Hyperlipidemia, unspecified: Secondary | ICD-10-CM

## 2016-03-21 DIAGNOSIS — Z1211 Encounter for screening for malignant neoplasm of colon: Secondary | ICD-10-CM

## 2016-03-21 DIAGNOSIS — I2510 Atherosclerotic heart disease of native coronary artery without angina pectoris: Secondary | ICD-10-CM

## 2016-03-21 DIAGNOSIS — Z23 Encounter for immunization: Secondary | ICD-10-CM | POA: Diagnosis not present

## 2016-03-21 NOTE — Progress Notes (Signed)
Pre visit review using our clinic review tool, if applicable. No additional management support is needed unless otherwise documented below in the visit note. 

## 2016-03-21 NOTE — Progress Notes (Signed)
Subjective:    Patient ID: Vincent Brock, male    DOB: Jan 01, 1941, 75 y.o.   MRN: [id number]  Chief Complaint  Patient presents with  . Establish Care    HPI Patient is in today for Establish care.  Pt sees Dr Acie Fredrickson for cardiology and all his prescriptions come from him.  He is asking about the shingles vaccine today.    Past Medical History  Diagnosis Date  . Hyperlipidemia   . Hypertension   . Coronary artery disease     status post PTCA and stenting of his right coronary artery in 2000  . History of prostate cancer   . History of myocardial infarction 07/15/1999    cardiac catherization revealed a tight 99% stenosis in mid right coronary artery -- with successful PTCA and stenting of mid right  . Arthritis   . Adenocarcinoma of prostate ([place])     Clinical stage T1C adenocarcinoma  . Chicken pox   . Frequent headaches   . Heart disease   . Kidney stones     Past Surgical History  Procedure Laterality Date  . Robot assisted laparoscopic radical prostatectomy  07/06/2006    Clinical stage T1C adenocarcinoma of the prostate  . Umbilical hernia repair  10/17/2004  . Retinal detachment repair w/ scleral buckle le  06/09/02    Retinal detachment, right eye  . Coronary angioplasty with stent placement  2000    stenting of RCA  . Appendectomy      Family History  Problem Relation Age of Onset  . Heart attack Father 41  . Cirrhosis Mother   . Coronary artery disease Sister     with stents  . Tuberculosis Father   . Heart failure Father     Social History   Social History  . Marital Status: Married    Spouse Name: N/A  . Number of Children: N/A  . Years of Education: N/A   Occupational History  . Not on file.   Social History Main Topics  . Smoking status: Former Smoker    Quit date: 10/22/2005  . Smokeless tobacco: Not on file  . Alcohol Use: Not on file  . Drug Use: Not on file  . Sexual Activity: Not on file   Other Topics Concern  . Not on file    Social History Narrative    Outpatient Prescriptions Prior to Visit  Medication Sig Dispense Refill  . aspirin 81 MG EC tablet Take 81 mg by mouth daily.      . Coenzyme Q10 (CO Q 10 PO) Take by mouth daily.    . Omega-3 Fatty Acids (FISH OIL PO) Take by mouth daily. (Total 5,000)    . atorvastatin (LIPITOR) 80 MG tablet TAKE ONE TABLET BY MOUTH ONCE DAILY AT  6PM 90 tablet 3  . metoprolol (LOPRESSOR) 50 MG tablet Take 0.5 tablets (25 mg total) by mouth 2 (two) times daily. 90 tablet 3  . nitroGLYCERIN (NITROSTAT) 0.4 MG SL tablet One tablet under tongue up to three times if pain continues after 3 doses call 911 25 tablet 3   No facility-administered medications prior to visit.    No Known Allergies  Review of Systems  Constitutional: Negative for fever and malaise/fatigue.  HENT: Negative for congestion.   Eyes: Negative for blurred vision.  Respiratory: Negative for shortness of breath.   Cardiovascular: Negative for chest pain, palpitations and leg swelling.  Gastrointestinal: Negative for nausea, abdominal pain and blood in stool.  Genitourinary:  Negative for dysuria and frequency.  Musculoskeletal: Negative for falls.  Skin: Negative for rash.  Neurological: Negative for dizziness, loss of consciousness and headaches.  Endo/Heme/Allergies: Negative for environmental allergies.  Psychiatric/Behavioral: Negative for depression. The patient is not nervous/anxious.        Objective:    Physical Exam  Constitutional: He is oriented to person, place, and time. He appears well-developed and well-nourished. No distress.  HENT:  Head: Normocephalic and atraumatic.  Eyes: Conjunctivae are normal.  Neck: Neck supple. No thyromegaly present.  Cardiovascular: Normal rate, regular rhythm and normal heart sounds.   No murmur heard. Pulmonary/Chest: Effort normal and breath sounds normal. No respiratory distress. He has no wheezes.  Abdominal: Soft. Bowel sounds are normal. He  exhibits no mass. There is no tenderness.  Musculoskeletal: He exhibits no edema.  Lymphadenopathy:    He has no cervical adenopathy.  Neurological: He is alert and oriented to person, place, and time.  Skin: Skin is warm and dry.  Psychiatric: He has a normal mood and affect. His behavior is normal.    BP 108/70 mmHg  Pulse 70  Temp(Src) 98.1 F (36.7 C) (Oral)  Ht 6\' 1"  (1.854 m)  Wt 194 lb 6 oz (88.168 kg)  BMI 25.65 kg/m2  SpO2 95% Wt Readings from Last 3 Encounters:  03/21/16 194 lb 6 oz (88.168 kg)  09/18/15 186 lb 9.6 oz (84.641 kg)  09/07/14 197 lb (89.359 kg)     Lab Results  Component Value Date   WBC 4.6 08/31/2014   HGB 14.4 08/31/2014   HCT 43.3 08/31/2014   PLT 183.0 08/31/2014   GLUCOSE 91 09/14/2015   CHOL 112 09/14/2015   TRIG 57.0 09/14/2015   HDL 48.90 09/14/2015   LDLCALC 51 09/14/2015   ALT 18 09/14/2015   AST 19 09/14/2015   NA 144 09/14/2015   K 4.3 09/14/2015   CL 108 09/14/2015   CREATININE 0.83 09/14/2015   BUN 14 09/14/2015   CO2 29 09/14/2015   INR 1.10 06/19/2011    No results found for: TSH Lab Results  Component Value Date   WBC 4.6 08/31/2014   HGB 14.4 08/31/2014   HCT 43.3 08/31/2014   MCV 92.9 08/31/2014   PLT 183.0 08/31/2014   Lab Results  Component Value Date   NA 144 09/14/2015   K 4.3 09/14/2015   CO2 29 09/14/2015   GLUCOSE 91 09/14/2015   BUN 14 09/14/2015   CREATININE 0.83 09/14/2015   BILITOT 0.7 09/14/2015   ALKPHOS 85 09/14/2015   AST 19 09/14/2015   ALT 18 09/14/2015   PROT 6.6 09/14/2015   ALBUMIN 4.1 09/14/2015   CALCIUM 9.1 09/14/2015   GFR 96.40 09/14/2015   Lab Results  Component Value Date   CHOL 112 09/14/2015   Lab Results  Component Value Date   HDL 48.90 09/14/2015   Lab Results  Component Value Date   LDLCALC 51 09/14/2015   Lab Results  Component Value Date   TRIG 57.0 09/14/2015   Lab Results  Component Value Date   CHOLHDL 2 09/14/2015   No results found for:  HGBA1C     Assessment & Plan:   Problem List Items Addressed This Visit      Unprioritized   CAD (coronary artery disease)    Per cardiology      Relevant Medications   nitroGLYCERIN (NITROSTAT) 0.4 MG SL tablet   metoprolol (LOPRESSOR) 50 MG tablet   atorvastatin (LIPITOR) 80 MG tablet  HTN (hypertension)    Per cardiology      Relevant Medications   nitroGLYCERIN (NITROSTAT) 0.4 MG SL tablet   metoprolol (LOPRESSOR) 50 MG tablet   atorvastatin (LIPITOR) 80 MG tablet   Hyperlipidemia    Per cardiology      Relevant Medications   nitroGLYCERIN (NITROSTAT) 0.4 MG SL tablet   metoprolol (LOPRESSOR) 50 MG tablet   atorvastatin (LIPITOR) 80 MG tablet   Need for shingles vaccine    D/w with pt that he would need to get this vaccine at the pharmacy       Other Visit Diagnoses    Need for vaccination with 13-polyvalent pneumococcal conjugate vaccine    -  Primary    Relevant Orders    Pneumococcal conjugate vaccine 13-valent IM (Completed)    Encounter for screening colonoscopy        Relevant Orders    Ambulatory referral to Gastroenterology       I am having Mr. Kubly maintain his aspirin, Coenzyme Q10 (CO Q 10 PO), Omega-3 Fatty Acids (FISH OIL PO), nitroGLYCERIN, metoprolol, and atorvastatin.  Meds ordered this encounter  Medications  . nitroGLYCERIN (NITROSTAT) 0.4 MG SL tablet    Sig: One tablet under tongue up to three times if pain continues after 3 doses call 911    Dispense:  25 tablet    Refill:  3  . metoprolol (LOPRESSOR) 50 MG tablet    Sig: Take 0.5 tablets (25 mg total) by mouth 2 (two) times daily.    Dispense:  90 tablet    Refill:  3  . atorvastatin (LIPITOR) 80 MG tablet    Sig: TAKE ONE TABLET BY MOUTH ONCE DAILY AT  6PM    Dispense:  90 tablet    Refill:  3     Ann Held, DO

## 2016-03-22 DIAGNOSIS — Z23 Encounter for immunization: Secondary | ICD-10-CM | POA: Insufficient documentation

## 2016-03-22 MED ORDER — METOPROLOL TARTRATE 50 MG PO TABS: 25.0000 mg | ORAL_TABLET | Freq: Two times a day (BID) | ORAL | Status: DC

## 2016-03-22 MED ORDER — ATORVASTATIN CALCIUM 80 MG PO TABS: ORAL_TABLET | ORAL | Status: DC

## 2016-03-22 MED ORDER — NITROGLYCERIN 0.4 MG SL SUBL: SUBLINGUAL_TABLET | SUBLINGUAL | Status: AC

## 2016-03-22 NOTE — Assessment & Plan Note (Signed)
Per cardiology 

## 2016-03-22 NOTE — Assessment & Plan Note (Signed)
D/w with pt that he would need to get this vaccine at the pharmacy

## 2016-04-16 DIAGNOSIS — H16223 Keratoconjunctivitis sicca, not specified as Sjogren's, bilateral: Secondary | ICD-10-CM | POA: Diagnosis not present

## 2016-05-13 DIAGNOSIS — D225 Melanocytic nevi of trunk: Secondary | ICD-10-CM | POA: Diagnosis not present

## 2016-05-13 DIAGNOSIS — L570 Actinic keratosis: Secondary | ICD-10-CM | POA: Diagnosis not present

## 2016-05-13 DIAGNOSIS — X32XXXA Exposure to sunlight, initial encounter: Secondary | ICD-10-CM | POA: Diagnosis not present

## 2016-05-13 DIAGNOSIS — L821 Other seborrheic keratosis: Secondary | ICD-10-CM | POA: Diagnosis not present

## 2016-08-15 DIAGNOSIS — C61 Malignant neoplasm of prostate: Secondary | ICD-10-CM | POA: Diagnosis not present

## 2016-08-15 DIAGNOSIS — N200 Calculus of kidney: Secondary | ICD-10-CM | POA: Diagnosis not present

## 2016-08-15 DIAGNOSIS — N5231 Erectile dysfunction following radical prostatectomy: Secondary | ICD-10-CM | POA: Diagnosis not present

## 2016-09-19 ENCOUNTER — Encounter: Payer: PPO | Admitting: Family Medicine

## 2016-09-19 ENCOUNTER — Ambulatory Visit: Payer: PPO | Admitting: *Deleted

## 2016-09-19 NOTE — Progress Notes (Deleted)
Subjective:   Vincent Brock is a 75 y.o. male who presents for an Initial Medicare Annual Wellness Visit.  Review of Systems  No ROS.  Medicare Wellness Visit.     Sleep patterns:    Home Safety/Smoke Alarms:   Living environment; residence and Firearm Safety:  Seat Belt Safety/Bike Helmet:    Counseling:   Eye Exam-  Dental-  Male:   CCS-     PSA- No results found for: PSA      Objective:    There were no vitals filed for this visit. There is no height or weight on file to calculate BMI.  Current Medications (verified) Outpatient Encounter Prescriptions as of 09/19/2016  Medication Sig  . aspirin 81 MG EC tablet Take 81 mg by mouth daily.    Marland Kitchen atorvastatin (LIPITOR) 80 MG tablet TAKE ONE TABLET BY MOUTH ONCE DAILY AT  6PM  . Coenzyme Q10 (CO Q 10 PO) Take by mouth daily.  . metoprolol (LOPRESSOR) 50 MG tablet Take 0.5 tablets (25 mg total) by mouth 2 (two) times daily.  . nitroGLYCERIN (NITROSTAT) 0.4 MG SL tablet One tablet under tongue up to three times if pain continues after 3 doses call 911  . Omega-3 Fatty Acids (FISH OIL PO) Take by mouth daily. (Total 5,000)   No facility-administered encounter medications on file as of 09/19/2016.     Allergies (verified) Review of patient's allergies indicates no known allergies.   History: Past Medical History:  Diagnosis Date  . Adenocarcinoma of prostate ([place])    Clinical stage T1C adenocarcinoma  . Arthritis   . Chicken pox   . Coronary artery disease    status post PTCA and stenting of his right coronary artery in 2000  . Frequent headaches   . Heart disease   . History of myocardial infarction 07/15/1999   cardiac catherization revealed a tight 99% stenosis in mid right coronary artery -- with successful PTCA and stenting of mid right  . History of prostate cancer   . Hyperlipidemia   . Hypertension   . Kidney stones    Past Surgical History:  Procedure Laterality Date  . APPENDECTOMY    .  CORONARY ANGIOPLASTY WITH STENT PLACEMENT  2000   stenting of RCA  . RETINAL DETACHMENT REPAIR W/ SCLERAL BUCKLE LE  06/09/02   Retinal detachment, right eye  . ROBOT ASSISTED LAPAROSCOPIC RADICAL PROSTATECTOMY  07/06/2006   Clinical stage T1C adenocarcinoma of the prostate  . UMBILICAL HERNIA REPAIR  10/17/2004   Family History  Problem Relation Age of Onset  . Heart attack Father 50  . Cirrhosis Mother   . Coronary artery disease Sister     with stents  . Tuberculosis Father   . Heart failure Father    Social History   Occupational History  . Not on file.   Social History Main Topics  . Smoking status: Former Smoker    Quit date: 10/22/2005  . Smokeless tobacco: Not on file  . Alcohol use Not on file  . Drug use: Unknown  . Sexual activity: Not on file   Tobacco Counseling Counseling given: Not Answered   Activities of Daily Living No flowsheet data found.  Immunizations and Health Maintenance Immunization History  Administered Date(s) Administered  . Pneumococcal Conjugate-13 03/21/2016   Health Maintenance Due  Topic Date Due  . TETANUS/TDAP  03/25/1961  . COLONOSCOPY  03/25/1992  . ZOSTAVAX  03/25/2002  . INFLUENZA VACCINE  07/22/2016    Patient  Care Team: Ann Held, DO as PCP - General (Family Medicine) Paralee Cancel, MD (Orthopedic Surgery) Franchot Gallo, MD as Consulting Physician (Urology)  Indicate any recent Medical Services you may have received from other than [facility] providers in the past year (date may be approximate).    Assessment:   This is a routine wellness examination for Vincent Brock. Physical assessment deferred to PCP.  Hearing/Vision screen No exam data present  Dietary issues and exercise activities discussed:    Diet (meal preparation, eat out, water intake, caffeinated beverages, dairy products, fruits and vegetables): Breakfast: Lunch:  Dinner:      Goals    None     Depression Screen No flowsheet data  found.  Fall Risk No flowsheet data found.  Cognitive Function: No flowsheet data found.  Screening Tests Health Maintenance  Topic Date Due  . TETANUS/TDAP  03/25/1961  . COLONOSCOPY  03/25/1992  . ZOSTAVAX  03/25/2002  . INFLUENZA VACCINE  07/22/2016  . PNA vac Low Risk Adult (2 of 2 - PPSV23) 03/21/2017        Plan:    Follow-up with Dr. Carollee Herter as scheduled.  During the course of the visit Zyron was educated and counseled about the following appropriate screening and preventive services:   Vaccines to include Pneumoccal, Influenza, Hepatitis B, Td, Zostavax, HCV  Electrocardiogram  Colorectal cancer screening  Cardiovascular disease screening  Diabetes screening  Glaucoma screening  Nutrition counseling  Prostate cancer screening  Smoking cessation counseling  Patient Instructions (the written plan) were given to the patient.   Dorrene German, RN   09/19/2016

## 2016-09-19 NOTE — Progress Notes (Deleted)
Pre visit review using our clinic review tool, if applicable. No additional management support is needed unless otherwise documented below in the visit note. 

## 2016-11-25 ENCOUNTER — Other Ambulatory Visit: Payer: Self-pay | Admitting: Cardiovascular Disease

## 2016-12-24 ENCOUNTER — Other Ambulatory Visit: Payer: Self-pay | Admitting: Cardiovascular Disease

## 2017-01-10 ENCOUNTER — Other Ambulatory Visit: Payer: Self-pay | Admitting: Cardiovascular Disease

## 2017-01-15 ENCOUNTER — Encounter (INDEPENDENT_AMBULATORY_CARE_PROVIDER_SITE_OTHER): Payer: Self-pay

## 2017-01-15 ENCOUNTER — Encounter: Payer: Self-pay | Admitting: Cardiovascular Disease

## 2017-01-15 ENCOUNTER — Ambulatory Visit (INDEPENDENT_AMBULATORY_CARE_PROVIDER_SITE_OTHER): Payer: PPO | Admitting: Cardiovascular Disease

## 2017-01-15 VITALS — BP 134/90 | HR 72 | Ht 73.0 in | Wt 190.8 lb

## 2017-01-15 DIAGNOSIS — I2510 Atherosclerotic heart disease of native coronary artery without angina pectoris: Secondary | ICD-10-CM | POA: Diagnosis not present

## 2017-01-15 DIAGNOSIS — I10 Essential (primary) hypertension: Secondary | ICD-10-CM

## 2017-01-15 MED ORDER — ATORVASTATIN CALCIUM 80 MG PO TABS: ORAL_TABLET | ORAL | 3 refills | Status: DC

## 2017-01-15 NOTE — Progress Notes (Signed)
[name] Date of Birth  Jan 12, 1941 [place] [address]    [place]   [place], [place]  [address]    [place], [place]  [address] [phone]  Fax  [phone]  [phone]  Fax [phone]  Problem list:  1. Coronary artery disease: Status post PTCA and stenting of the RCA in 2000. Who placed a 4.0 x 18 mm NIR Royal post dilated up to 4.4 mm 2. Hyperlipidemia 3. Hypertension 4. Prostate cancer  History of Present Illness:  76 yo with cad.  No chest pain.  Works out every day.  Had knee surgery several months ago.    Sept. 12, 2014:  No angina.  Has had some BP issues - up and down.   Typical readings are 130 / 85.  Lots of anxiety at night.   He has been going to the [facility].    Sept. 17, 2015:  Doing well.  No dyspnea, no CP,  Fatigues eaily.   Working out at [employer].  Sept. 27, 2016:  Doing well.  No CP ,  BP is a bit higher today . Typically is i the normal range.   Jan. 25, 2018:   Vincent Brock is doing well.   Has not been exercising as much.  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  Wears him out mentally - is not getting much exercise.  BP has been good    Current Outpatient Prescriptions on File Prior to Visit  Medication Sig Dispense Refill  . aspirin 81 MG EC tablet Take 81 mg by mouth daily.      Marland Kitchen atorvastatin (LIPITOR) 80 MG tablet TAKE ONE TABLET BY MOUTH ONCE DAILY AT 6PM 30 tablet 0  . Coenzyme Q10 (CO Q 10 PO) Take by mouth daily.    . metoprolol (LOPRESSOR) 50 MG tablet Take 0.5 tablets (25 mg total) by mouth 2 (two) times daily. 90 tablet 3  . nitroGLYCERIN (NITROSTAT) 0.4 MG SL tablet One tablet under tongue up to three times if pain continues after 3 doses call 911 25 tablet 3  . Omega-3 Fatty Acids (FISH OIL PO) Take by mouth daily. (Total 5,000)     No current facility-administered medications on file prior to visit.      No Known Allergies  Past Medical History:  Diagnosis Date  . Adenocarcinoma of prostate ([place])    Clinical stage T1C adenocarcinoma  . Arthritis   . Chicken pox   . Coronary artery disease    status post PTCA and stenting of his right coronary artery in 2000  . Frequent headaches   . Heart disease   . History of myocardial infarction 07/15/1999   cardiac catherization revealed a tight 99% stenosis in mid right coronary artery -- with successful PTCA and stenting of mid right  . History of prostate cancer   . Hyperlipidemia   . Hypertension   . Kidney stones     Past Surgical History:  Procedure Laterality Date  . APPENDECTOMY    . CORONARY ANGIOPLASTY WITH STENT PLACEMENT  2000   stenting of RCA  . RETINAL DETACHMENT REPAIR W/ SCLERAL BUCKLE LE  06/09/02   Retinal detachment, right eye  . ROBOT ASSISTED LAPAROSCOPIC RADICAL PROSTATECTOMY  07/06/2006   Clinical stage T1C adenocarcinoma of the prostate  . UMBILICAL HERNIA  REPAIR  10/17/2004    History  Smoking Status  . Former Smoker  . Quit date: 10/22/2005  Smokeless Tobacco  . Never Used    History  Alcohol use Not on file    Family History  Problem Relation Age of Onset  . Heart attack Father 57  . Cirrhosis Mother   . Coronary artery disease Sister     with stents  . Tuberculosis Father   . Heart failure Father     Reviw of Systems:  Reviewed in the HPI.  All other systems are negative.  Physical Exam: Blood pressure 134/90, pulse 72, height 6\' 1"  (1.854 m), weight 190 lb 12.8 oz (86.5 kg), SpO2 97 %. General: Well developed, well nourished, in no acute distress.  Head: Normocephalic, atraumatic, sclera non-icteric, mucus membranes are moist,   Neck: Supple. Negative for carotid bruits. JVD not elevated.  Lungs: Clear bilaterally to auscultation without wheezes, rales, or rhonchi. Breathing is unlabored.  Heart: RRR with S1 S2. No murmurs, rubs, or gallops appreciated.  Abdomen: Soft,  non-tender, non-distended with normoactive bowel sounds. No hepatomegaly. No rebound/guarding. No obvious abdominal masses.  Msk:  Strength and tone appear normal for age.  Extremities: No clubbing or cyanosis. No edema.  Distal pedal pulses are 2+ and equal bilaterally.  Neuro: Alert and oriented X 3. Moves all extremities spontaneously.  Psych:  Responds to questions appropriately with a normal affect.  ECG: Jan. 25, 2018:   NSR at 72.  Normal ECG   Assessment / Plan:   1. Coronary artery disease: Status post PTCA and stenting of the RCA in 2000. Who placed a 4.0 x 18 mm NIR Royal post dilated up to 4.4 mm 2. Hyperlipidemia - check fasting lipids today.   Continue current meds.   3. Hypertension- his blood pressure is well-controlled. Continue current medications.  4. Prostate cancer    Mertie Moores, MD  01/15/2017 3:44 PM    [place],  [place], [place]  [address] Pager [phone] Phone: [phone]; Fax: [phone]

## 2017-01-15 NOTE — Patient Instructions (Signed)
Medication Instructions:  Your physician recommends that you continue on your current medications as directed. Please refer to the Current Medication list given to you today.   Labwork: TODAY - cholesterol, complete metabolic panel   Testing/Procedures: None Ordered   Follow-Up: Your physician wants you to follow-up in: 1 year with Dr. Nahser.  You will receive a reminder letter in the mail two months in advance. If you don't receive a letter, please call our office to schedule the follow-up appointment.   If you need a refill on your cardiac medications before your next appointment, please call your pharmacy.   Thank you for choosing [facility]! Lewi Drost, RN [phone]    

## 2017-01-16 LAB — COMPREHENSIVE METABOLIC PANEL
A/G RATIO: 1.8 (ref 1.2–2.2)
ALK PHOS: 104 IU/L (ref 39–117)
ALT: 21 IU/L (ref 0–44)
AST: 19 IU/L (ref 0–40)
Albumin: 4.5 g/dL (ref 3.5–4.8)
BUN/Creatinine Ratio: 13 (ref 10–24)
BUN: 11 mg/dL (ref 8–27)
Bilirubin Total: 0.7 mg/dL (ref 0.0–1.2)
CALCIUM: 9.2 mg/dL (ref 8.6–10.2)
CHLORIDE: 104 mmol/L (ref 96–106)
CO2: 20 mmol/L (ref 18–29)
Creatinine, Ser: 0.87 mg/dL (ref 0.76–1.27)
GFR calc Af Amer: 98 mL/min/{1.73_m2} (ref >59)
GFR, EST NON AFRICAN AMERICAN: 85 mL/min/{1.73_m2} (ref >59)
GLOBULIN, TOTAL: 2.5 g/dL (ref 1.5–4.5)
Glucose: 80 mg/dL (ref 65–99)
POTASSIUM: 4.5 mmol/L (ref 3.5–5.2)
SODIUM: 144 mmol/L (ref 134–144)
Total Protein: 7 g/dL (ref 6.0–8.5)

## 2017-01-16 LAB — LIPID PANEL
CHOL/HDL RATIO: 3.1 ratio (ref 0.0–5.0)
Cholesterol, Total: 131 mg/dL (ref 100–199)
HDL: 42 mg/dL (ref >39)
LDL CALC: 75 mg/dL (ref 0–99)
TRIGLYCERIDES: 71 mg/dL (ref 0–149)
VLDL CHOLESTEROL CAL: 14 mg/dL (ref 5–40)

## 2017-01-21 ENCOUNTER — Telehealth: Payer: Self-pay | Admitting: Family Medicine

## 2017-01-21 NOTE — Telephone Encounter (Signed)
Attempted to reach patient to schedule AWV and CPE same day and patient asked to call back and 5 minutes

## 2017-01-22 NOTE — Telephone Encounter (Signed)
Reached out to patient and he stated he will call back in 3minutes.

## 2017-01-26 ENCOUNTER — Ambulatory Visit (INDEPENDENT_AMBULATORY_CARE_PROVIDER_SITE_OTHER): Payer: PPO | Admitting: Family Medicine

## 2017-01-26 ENCOUNTER — Encounter: Payer: Self-pay | Admitting: Family Medicine

## 2017-01-26 VITALS — BP 140/75 | HR 75 | Temp 98.6°F | Resp 18 | Ht 73.0 in | Wt 193.0 lb

## 2017-01-26 DIAGNOSIS — Z0000 Encounter for general adult medical examination without abnormal findings: Secondary | ICD-10-CM | POA: Diagnosis not present

## 2017-01-26 DIAGNOSIS — E785 Hyperlipidemia, unspecified: Secondary | ICD-10-CM | POA: Diagnosis not present

## 2017-01-26 DIAGNOSIS — I10 Essential (primary) hypertension: Secondary | ICD-10-CM

## 2017-01-26 DIAGNOSIS — I2510 Atherosclerotic heart disease of native coronary artery without angina pectoris: Secondary | ICD-10-CM

## 2017-01-26 DIAGNOSIS — Z1211 Encounter for screening for malignant neoplasm of colon: Secondary | ICD-10-CM

## 2017-01-26 NOTE — Progress Notes (Signed)
Pre visit review using our clinic review tool, if applicable. No additional management support is needed unless otherwise documented below in the visit note. 

## 2017-01-26 NOTE — Patient Instructions (Addendum)
Continue to eat heart healthy diet (full of fruits, vegetables, whole grains, lean protein, water--limit salt, fat, and sugar intake) and increase physical activity as tolerated.  Continue doing brain stimulating activities (puzzles, reading, adult coloring books, staying active) to keep memory sharp.   Go to [facility]'s pharmacy to receive flu shot.  Health Maintenance, Male A healthy lifestyle and preventative care can promote health and wellness.  Maintain regular health, dental, and eye exams.  Eat a healthy diet. Foods like vegetables, fruits, whole grains, low-fat dairy products, and lean protein foods contain the nutrients you need and are low in calories. Decrease your intake of foods high in solid fats, added sugars, and salt. Get information about a proper diet from your health care provider, if necessary.  Regular physical exercise is one of the most important things you can do for your health. Most adults should get at least 150 minutes of moderate-intensity exercise (any activity that increases your heart rate and causes you to sweat) each week. In addition, most adults need muscle-strengthening exercises on 2 or more days a week.   Maintain a healthy weight. The body mass index (BMI) is a screening tool to identify possible weight problems. It provides an estimate of body fat based on height and weight. Your health care provider can find your BMI and can help you achieve or maintain a healthy weight. For males 20 years and older:  A BMI below 18.5 is considered underweight.  A BMI of 18.5 to 24.9 is normal.  A BMI of 25 to 29.9 is considered overweight.  A BMI of 30 and above is considered obese.  Maintain normal blood lipids and cholesterol by exercising and minimizing your intake of saturated fat. Eat a balanced diet with plenty of fruits and vegetables. Blood tests for lipids and cholesterol should begin at age 24 and be repeated every 5 years. If your lipid or cholesterol  levels are high, you are over age 57, or you are at high risk for heart disease, you may need your cholesterol levels checked more frequently.Ongoing high lipid and cholesterol levels should be treated with medicines if diet and exercise are not working.  If you smoke, find out from your health care provider how to quit. If you do not use tobacco, do not start.  Lung cancer screening is recommended for adults aged 85-80 years who are at high risk for developing lung cancer because of a history of smoking. A yearly low-dose CT scan of the lungs is recommended for people who have at least a 30-pack-year history of smoking and are current smokers or have quit within the past 15 years. A pack year of smoking is smoking an average of 1 pack of cigarettes a day for 1 year (for example, a 30-pack-year history of smoking could mean smoking 1 pack a day for 30 years or 2 packs a day for 15 years). Yearly screening should continue until the smoker has stopped smoking for at least 15 years. Yearly screening should be stopped for people who develop a health problem that would prevent them from having lung cancer treatment.  If you choose to drink alcohol, do not have more than 2 drinks per day. One drink is considered to be 12 oz (360 mL) of beer, 5 oz (150 mL) of wine, or 1.5 oz (45 mL) of liquor.  Avoid the use of street drugs. Do not share needles with anyone. Ask for help if you need support or instructions about stopping the use  of drugs.  High blood pressure causes heart disease and increases the risk of stroke. High blood pressure is more likely to develop in:  People who have blood pressure in the end of the normal range (100-139/85-89 mm Hg).  People who are overweight or obese.  People who are African American.  If you are 65-29 years of age, have your blood pressure checked every 3-5 years. If you are 44 years of age or older, have your blood pressure checked every year. You should have your blood  pressure measured twice-once when you are at a hospital or clinic, and once when you are not at a hospital or clinic. Record the average of the two measurements. To check your blood pressure when you are not at a hospital or clinic, you can use:  An automated blood pressure machine at a pharmacy.  A home blood pressure monitor.  If you are 101-48 years old, ask your health care provider if you should take aspirin to prevent heart disease.  Diabetes screening involves taking a blood sample to check your fasting blood sugar level. This should be done once every 3 years after age 26 if you are at a normal weight and without risk factors for diabetes. Testing should be considered at a younger age or be carried out more frequently if you are overweight and have at least 1 risk factor for diabetes.  Colorectal cancer can be detected and often prevented. Most routine colorectal cancer screening begins at the age of 72 and continues through age 20. However, your health care provider may recommend screening at an earlier age if you have risk factors for colon cancer. On a yearly basis, your health care provider may provide home test kits to check for hidden blood in the stool. A small camera at the end of a tube may be used to directly examine the colon (sigmoidoscopy or colonoscopy) to detect the earliest forms of colorectal cancer. Talk to your health care provider about this at age 25 when routine screening begins. A direct exam of the colon should be repeated every 5-10 years through age 16, unless early forms of precancerous polyps or small growths are found.  People who are at an increased risk for hepatitis B should be screened for this virus. You are considered at high risk for hepatitis B if:  You were born in a country where hepatitis B occurs often. Talk with your health care provider about which countries are considered high risk.  Your parents were born in a high-risk country and you have not  received a shot to protect against hepatitis B (hepatitis B vaccine).  You have HIV or AIDS.  You use needles to inject street drugs.  You live with, or have sex with, someone who has hepatitis B.  You are a man who has sex with other men (MSM).  You get hemodialysis treatment.  You take certain medicines for conditions like cancer, organ transplantation, and autoimmune conditions.  Hepatitis C blood testing is recommended for all people born from [birth year] through 1964 and any individual with known risk factors for hepatitis C.  Healthy men should no longer receive prostate-specific antigen (PSA) blood tests as part of routine cancer screening. Talk to your health care provider about prostate cancer screening.  Testicular cancer screening is not recommended for adolescents or adult males who have no symptoms. Screening includes self-exam, a health care provider exam, and other screening tests. Consult with your health care provider about any symptoms you  have or any concerns you have about testicular cancer.  Practice safe sex. Use condoms and avoid high-risk sexual practices to reduce the spread of sexually transmitted infections (STIs).  You should be screened for STIs, including gonorrhea and chlamydia if:  You are sexually active and are younger than 24 years.  You are older than 24 years, and your health care provider tells you that you are at risk for this type of infection.  Your sexual activity has changed since you were last screened, and you are at an increased risk for chlamydia or gonorrhea. Ask your health care provider if you are at risk.  If you are at risk of being infected with HIV, it is recommended that you take a prescription medicine daily to prevent HIV infection. This is called pre-exposure prophylaxis (PrEP). You are considered at risk if:  You are a man who has sex with other men (MSM).  You are a heterosexual man who is sexually active with multiple  partners.  You take drugs by injection.  You are sexually active with a partner who has HIV.  Talk with your health care provider about whether you are at high risk of being infected with HIV. If you choose to begin PrEP, you should first be tested for HIV. You should then be tested every 3 months for as long as you are taking PrEP.  Use sunscreen. Apply sunscreen liberally and repeatedly throughout the day. You should seek shade when your shadow is shorter than you. Protect yourself by wearing long sleeves, pants, a wide-brimmed hat, and sunglasses year round whenever you are outdoors.  Tell your health care provider of new moles or changes in moles, especially if there is a change in shape or color. Also, tell your health care provider if a mole is larger than the size of a pencil eraser.  A one-time screening for abdominal aortic aneurysm (AAA) and surgical repair of large AAAs by ultrasound is recommended for men aged 43-75 years who are current or former smokers.  Stay current with your vaccines (immunizations). This information is not intended to replace advice given to you by your health care provider. Make sure you discuss any questions you have with your health care provider. Document Released: 06/05/2008 Document Revised: 12/29/2014 Document Reviewed: 09/11/2015 Elsevier Interactive Patient Education  2017 [employer].

## 2017-01-26 NOTE — Assessment & Plan Note (Signed)
Stable con't meds 

## 2017-01-26 NOTE — Progress Notes (Signed)
Patient ID: Vincent Brock, male   DOB: 28-Jun-1941, 76 y.o.   MRN: [id number]   Subjective:    Patient ID: Vincent Brock, male    DOB: 06-16-1941, 76 y.o.   MRN: [id number]  Chief Complaint  Patient presents with  . Medicare Wellness  . Annual Exam  . Hypertension  . Hyperlipidemia  I acted as a Education administrator for Dr. Rosemary Holms, RMA   Hypertension  This is a chronic problem. The current episode started more than 1 year ago. The problem is unchanged. Associated symptoms include blurred vision. Pertinent negatives include no anxiety, chest pain, headaches, malaise/fatigue, palpitations, shortness of breath or sweats. (Not seeing well at night.) There are no known risk factors for coronary artery disease. The current treatment provides mild improvement.  Hyperlipidemia  This is a chronic problem. The current episode started more than 1 year ago. The problem is controlled. He has no history of obesity. There are no known factors aggravating his hyperlipidemia. Pertinent negatives include no chest pain or shortness of breath. The current treatment provides mild improvement of lipids.    Patient is in today for an annual exam following up on hypertension and hyperlipidemia.  Past Medical History:  Diagnosis Date  . Adenocarcinoma of prostate ([place])    Clinical stage T1C adenocarcinoma  . Arthritis   . Chicken pox   . Coronary artery disease    status post PTCA and stenting of his right coronary artery in 2000  . Frequent headaches   . Heart disease   . History of myocardial infarction 07/15/1999   cardiac catherization revealed a tight 99% stenosis in mid right coronary artery -- with successful PTCA and stenting of mid right  . History of prostate cancer   . Hyperlipidemia   . Hypertension   . Kidney stones     Past Surgical History:  Procedure Laterality Date  . APPENDECTOMY    . CORONARY ANGIOPLASTY WITH STENT PLACEMENT  2000   stenting of RCA  . RETINAL DETACHMENT  REPAIR W/ SCLERAL BUCKLE LE  06/09/02   Retinal detachment, right eye  . ROBOT ASSISTED LAPAROSCOPIC RADICAL PROSTATECTOMY  07/06/2006   Clinical stage T1C adenocarcinoma of the prostate  . UMBILICAL HERNIA REPAIR  10/17/2004    Family History  Problem Relation Age of Onset  . Heart attack Father 48  . Tuberculosis Father   . Heart failure Father   . Cirrhosis Mother   . Coronary artery disease Sister     with stents    Social History   Social History  . Marital status: Married    Spouse name: N/A  . Number of children: N/A  . Years of education: N/A   Occupational History  . Not on file.   Social History Main Topics  . Smoking status: Former Smoker    Quit date: 10/22/2005  . Smokeless tobacco: Never Used  . Alcohol use No  . Drug use: No  . Sexual activity: Yes   Other Topics Concern  . Not on file   Social History Narrative  . No narrative on file    Outpatient Medications Prior to Visit  Medication Sig Dispense Refill  . aspirin 81 MG EC tablet Take 81 mg by mouth daily.      Marland Kitchen atorvastatin (LIPITOR) 80 MG tablet TAKE ONE TABLET BY MOUTH ONCE DAILY AT 6PM 90 tablet 3  . Coenzyme Q10 (CO Q 10 PO) Take by mouth daily.    . metoprolol (LOPRESSOR) 50 MG  tablet Take 0.5 tablets (25 mg total) by mouth 2 (two) times daily. 90 tablet 3  . nitroGLYCERIN (NITROSTAT) 0.4 MG SL tablet One tablet under tongue up to three times if pain continues after 3 doses call 911 25 tablet 3  . Omega-3 Fatty Acids (FISH OIL PO) Take by mouth daily. (Total 5,000)     No facility-administered medications prior to visit.     No Known Allergies  Review of Systems  Constitutional: Negative for fever and malaise/fatigue.  HENT: Negative for congestion.   Eyes: Positive for blurred vision.  Respiratory: Negative for cough and shortness of breath.   Cardiovascular: Negative for chest pain, palpitations and leg swelling.  Gastrointestinal: Negative for vomiting.  Musculoskeletal:  Negative for back pain.  Skin: Negative for rash.  Neurological: Negative for loss of consciousness and headaches.       Objective:    Physical Exam  Constitutional: He is oriented to person, place, and time. He appears well-developed and well-nourished. No distress.  HENT:  Head: Normocephalic and atraumatic.  Eyes: Conjunctivae are normal.  Neck: Normal range of motion. No thyromegaly present.  Cardiovascular: Normal rate and regular rhythm.   Pulmonary/Chest: Effort normal and breath sounds normal. He has no wheezes.  Abdominal: Soft. Bowel sounds are normal. There is no tenderness.  Genitourinary:  Genitourinary Comments: Per urology  Musculoskeletal: Normal range of motion. He exhibits no edema or deformity.  Neurological: He is alert and oriented to person, place, and time.  Skin: Skin is warm and dry. He is not diaphoretic.  Psychiatric: He has a normal mood and affect.    BP 140/75 (BP Location: Left Arm, Patient Position: Sitting, Cuff Size: Normal)   Pulse 75   Temp 98.6 F (37 C) (Oral)   Resp 18   Ht 6\' 1"  (1.854 m)   Wt 193 lb (87.5 kg)   SpO2 98%   BMI 25.46 kg/m  Wt Readings from Last 3 Encounters:  01/26/17 193 lb (87.5 kg)  01/15/17 190 lb 12.8 oz (86.5 kg)  03/21/16 194 lb 6 oz (88.2 kg)     Lab Results  Component Value Date   WBC 4.6 08/31/2014   HGB 14.4 08/31/2014   HCT 43.3 08/31/2014   PLT 183.0 08/31/2014   GLUCOSE 80 01/15/2017   CHOL 131 01/15/2017   TRIG 71 01/15/2017   HDL 42 01/15/2017   LDLCALC 75 01/15/2017   ALT 21 01/15/2017   AST 19 01/15/2017   NA 144 01/15/2017   K 4.5 01/15/2017   CL 104 01/15/2017   CREATININE 0.87 01/15/2017   BUN 11 01/15/2017   CO2 20 01/15/2017   INR 1.10 06/19/2011    No results found for: TSH Lab Results  Component Value Date   WBC 4.6 08/31/2014   HGB 14.4 08/31/2014   HCT 43.3 08/31/2014   MCV 92.9 08/31/2014   PLT 183.0 08/31/2014   Lab Results  Component Value Date   NA 144  01/15/2017   K 4.5 01/15/2017   CO2 20 01/15/2017   GLUCOSE 80 01/15/2017   BUN 11 01/15/2017   CREATININE 0.87 01/15/2017   BILITOT 0.7 01/15/2017   ALKPHOS 104 01/15/2017   AST 19 01/15/2017   ALT 21 01/15/2017   PROT 7.0 01/15/2017   ALBUMIN 4.5 01/15/2017   CALCIUM 9.2 01/15/2017   GFR 96.40 09/14/2015   Lab Results  Component Value Date   CHOL 131 01/15/2017   Lab Results  Component Value Date   HDL  42 01/15/2017   Lab Results  Component Value Date   LDLCALC 75 01/15/2017   Lab Results  Component Value Date   TRIG 71 01/15/2017   Lab Results  Component Value Date   CHOLHDL 3.1 01/15/2017   No results found for: HGBA1C     Assessment & Plan:   Problem List Items Addressed This Visit      Unprioritized   CAD (coronary artery disease)    Per cardiology      HTN (hypertension)    Stable con't meds      Hyperlipidemia    Per cardiology       Other Visit Diagnoses    Colon cancer screening    -  Primary   Relevant Orders   Ambulatory referral to Gastroenterology (for Colonoscopy)   Encounter for Medicare annual wellness exam          I am having Mr. Grzyb maintain his aspirin, Coenzyme Q10 (CO Q 10 PO), Omega-3 Fatty Acids (FISH OIL PO), nitroGLYCERIN, metoprolol, and atorvastatin.  No orders of the defined types were placed in this encounter.   CMA served as Education administrator during this visit. History, Physical and Plan performed by medical provider. Documentation and orders reviewed and attested to.  Ann Held, DO

## 2017-01-26 NOTE — Assessment & Plan Note (Signed)
Per cardiology 

## 2017-01-26 NOTE — Progress Notes (Signed)
Subjective:   Vincent Brock is a 76 y.o. male who presents for an Initial Medicare Annual Wellness Visit.  Review of Systems  No ROS.  Medicare Wellness Visit.  Cardiac Risk Factors include: hypertension;male gender;dyslipidemia;family history of premature cardiovascular disease;advanced age (>76men, >74 women) Sleep patterns: falls asleep easily, feels rested on waking and sleeps 6-7 hours nightly.   Home Safety/Smoke Alarms:  Feels safe in home. Smoke alarms in place.   Living environment; residence and Firearm Safety: [place], firearms stored safely. Lives with wife Seat Belt Safety/Bike Helmet: Wears seat belt.   Counseling:   Eye Exam- yearly Dr. [facility]- last 6/17 goes yearly  Male:   CCS-  Last: over 10 years ago [facility] Dr. Diona Brock     Objective:    Today's Vitals   01/26/17 1310 01/26/17 1349  BP: (!) 142/76 140/75  Pulse: 75 75  Resp: 18   Temp: 97.6 F (36.4 C) 98.6 F (37 C)  TempSrc: Oral Oral  SpO2: 98% 98%  Weight: 193 lb (87.5 kg) 193 lb (87.5 kg)  Height: 6\' 1"  (1.854 m)    Body mass index is 25.46 kg/m.  Current Medications (verified) Outpatient Encounter Prescriptions as of 01/26/2017  Medication Sig  . aspirin 81 MG EC tablet Take 81 mg by mouth daily.    Marland Kitchen atorvastatin (LIPITOR) 80 MG tablet TAKE ONE TABLET BY MOUTH ONCE DAILY AT 6PM  . Coenzyme Q10 (CO Q 10 PO) Take by mouth daily.  . metoprolol (LOPRESSOR) 50 MG tablet Take 0.5 tablets (25 mg total) by mouth 2 (two) times daily.  . nitroGLYCERIN (NITROSTAT) 0.4 MG SL tablet One tablet under tongue up to three times if pain continues after 3 doses call 911  . Omega-3 Fatty Acids (FISH OIL PO) Take by mouth daily. (Total 5,000)   No facility-administered encounter medications on file as of 01/26/2017.     Allergies (verified) Patient has no known allergies.   History: Past Medical History:  Diagnosis Date  . 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 Arthritis   . Chicken pox   . Coronary artery disease    status post PTCA and stenting of his right coronary artery in 2000  . Frequent headaches   . Heart disease   . History of myocardial infarction 07/15/1999   cardiac catherization revealed a tight 99% stenosis in mid right coronary artery -- with successful PTCA and stenting of mid right  . History of prostate cancer   . Hyperlipidemia   . Hypertension   . Kidney stones    Past Surgical History:  Procedure Laterality Date  . APPENDECTOMY    . CORONARY ANGIOPLASTY WITH STENT PLACEMENT  2000   stenting of RCA  . RETINAL DETACHMENT REPAIR W/ SCLERAL BUCKLE LE  06/09/02   Retinal detachment, right eye  . ROBOT ASSISTED LAPAROSCOPIC RADICAL PROSTATECTOMY  07/06/2006   Clinical stage T1C adenocarcinoma of the prostate  . UMBILICAL HERNIA REPAIR  10/17/2004   Family History  Problem Relation Age of Onset  . Heart attack Father 77  . Tuberculosis Father   . Heart failure Father   . Cirrhosis Mother   . Coronary artery disease Sister     with stents   Social History   Occupational History  . Not on file.   Social History Main Topics  . Smoking status: Former Smoker    Quit date: 10/22/2005  . Smokeless tobacco: Never Used  .  Alcohol use No  . Drug use: No  . Sexual activity: Yes   Tobacco Counseling Counseling given: Not Answered   Activities of Daily Living In your present state of health, do you have any difficulty performing the following activities: 01/26/2017  Hearing? Y  Vision? N  Difficulty concentrating or making decisions? N  Walking or climbing stairs? N  Dressing or bathing? N  Doing errands, shopping? N  Preparing Food and eating ? N  Using the Toilet? N  In the past six months, have you accidently leaked urine? N  Do you have problems with loss of bowel control? N  Managing your Medications? N  Managing your Finances? N  Housekeeping or managing your Housekeeping? N    Some recent data might be hidden    Immunizations and Health Maintenance Immunization History  Administered Date(s) Administered  . Pneumococcal Conjugate-13 03/21/2016   Health Maintenance Due  Topic Date Due  . TETANUS/TDAP  03/25/1961  . COLONOSCOPY  03/25/1992  . ZOSTAVAX  03/25/2002    Patient Care Team: Ann Held, DO as PCP - General (Family Medicine) Paralee Cancel, MD (Orthopedic Surgery) Franchot Gallo, MD as Consulting Physician (Urology) Thayer Headings, MD as Consulting Physician (Cardiology)  Indicate any recent Medical Services you may have received from other than [facility] providers in the past year (date may be approximate).    Assessment:   This is a routine wellness examination for Vincent Brock. Physical assessment deferred to PCP.   Hearing/Vision screen Hearing Screening Comments: HOH, does not have hearing aides.  Vision Screening Comments: Detached retina right eye, s/p surgery 10 years ago   Dietary issues and exercise activities discussed: Current Exercise Habits: The patient has a physically strenous job, but has no regular exercise apart from work., Exercise limited by: None identified  Diet (meal preparation, eat out, water intake, caffeinated beverages, dairy products, fruits and vegetables): in general, an "unhealthy" diet I eat too much fast food. Skip breakfast, eat light lunch, tuna salad, casseroles,  out 25% Patient drinks nothing but water.   Reviewed heart healthy diet and suggested to increase healthy eating. Reviewed decrease salt intake.  Goals    . Exercise 3x per week (30 min per time)          Start going back to the [facility] with wife.       Depression Screen No flowsheet data found.  Fall Risk No flowsheet data found.  Cognitive Function: MMSE - Mini Mental State Exam 01/26/2017  Orientation to time 5  Orientation to Place 5  Registration 3  Attention/ Calculation 5  Recall 0  Language- name 2 objects 2  Language-  repeat 1  Language- follow 3 step command 3  Language- read & follow direction 1  Write a sentence 1  Copy design 1  Total score 27        Screening Tests Health Maintenance  Topic Date Due  . TETANUS/TDAP  03/25/1961  . COLONOSCOPY  03/25/1992  . ZOSTAVAX  03/25/2002  . INFLUENZA VACCINE  03/22/2019 (Originally 07/22/2016)  . PNA vac Low Risk Adult (2 of 2 - PPSV23) 03/21/2017        Plan:     Continue to eat heart healthy diet (full of fruits, vegetables, whole grains, lean protein, water--limit salt, fat, and sugar intake) and increase physical activity as tolerated.  Continue doing brain stimulating activities (puzzles, reading, adult coloring books, staying active) to keep memory sharp.   Go to grand-son's pharmacy to  receive flu shot.  Scheduled colonoscopy referral placed today.   During the course of the visit Tshombe was educated and counseled about the following appropriate screening and preventive services:   Vaccines to include Pneumoccal, Influenza, Hepatitis B, Td, Zostavax, HCV  Colorectal cancer screening  Cardiovascular disease screening  Diabetes screening  Glaucoma screening  Nutrition counseling  Prostate cancer screening  Patient Instructions (the written plan) were given to the patient.   Michiel Cowboy, RN   01/26/2017

## 2017-02-02 ENCOUNTER — Other Ambulatory Visit: Payer: Self-pay | Admitting: Cardiovascular Disease

## 2017-03-25 DIAGNOSIS — M542 Cervicalgia: Secondary | ICD-10-CM | POA: Diagnosis not present

## 2017-04-22 DIAGNOSIS — M542 Cervicalgia: Secondary | ICD-10-CM | POA: Diagnosis not present

## 2017-05-14 DIAGNOSIS — M47812 Spondylosis without myelopathy or radiculopathy, cervical region: Secondary | ICD-10-CM | POA: Diagnosis not present

## 2017-06-09 DIAGNOSIS — M47812 Spondylosis without myelopathy or radiculopathy, cervical region: Secondary | ICD-10-CM | POA: Diagnosis not present

## 2017-06-11 ENCOUNTER — Ambulatory Visit (INDEPENDENT_AMBULATORY_CARE_PROVIDER_SITE_OTHER): Payer: PPO | Admitting: Ophthalmology

## 2017-06-11 DIAGNOSIS — H338 Other retinal detachments: Secondary | ICD-10-CM | POA: Diagnosis not present

## 2017-06-11 DIAGNOSIS — H43813 Vitreous degeneration, bilateral: Secondary | ICD-10-CM

## 2018-02-08 ENCOUNTER — Other Ambulatory Visit: Payer: Self-pay | Admitting: Cardiovascular Disease

## 2018-02-10 ENCOUNTER — Other Ambulatory Visit: Payer: Self-pay | Admitting: Cardiovascular Disease

## 2018-02-10 NOTE — Telephone Encounter (Signed)
Medication Detail    Disp Refills Start End   atorvastatin (LIPITOR) 80 MG tablet 90 tablet 0 02/09/2018    Sig: TAKE ONE TABLET BY MOUTH ONCE DAILY AT 6PM   Sent to pharmacy as: atorvastatin (LIPITOR) 80 MG tablet   E-Prescribing Status: Receipt confirmed by pharmacy (02/09/2018 10:40 AM EST)   Pharmacy   [place], [place] [facility] [place]

## 2018-03-05 ENCOUNTER — Other Ambulatory Visit: Payer: Self-pay | Admitting: Cardiovascular Disease

## 2018-04-21 ENCOUNTER — Encounter: Payer: Self-pay | Admitting: Cardiovascular Disease

## 2018-04-21 ENCOUNTER — Ambulatory Visit: Payer: PPO | Admitting: Cardiovascular Disease

## 2018-04-21 VITALS — BP 140/92 | HR 59 | Ht 73.0 in | Wt 188.1 lb

## 2018-04-21 DIAGNOSIS — E782 Mixed hyperlipidemia: Secondary | ICD-10-CM

## 2018-04-21 DIAGNOSIS — I10 Essential (primary) hypertension: Secondary | ICD-10-CM

## 2018-04-21 DIAGNOSIS — I2510 Atherosclerotic heart disease of native coronary artery without angina pectoris: Secondary | ICD-10-CM | POA: Diagnosis not present

## 2018-04-21 NOTE — Progress Notes (Signed)
[name] Date of Birth  [birth year]  [facility]        [address]    [place]     [place], [place]  [address]      [phone]  Fax  [phone]     Problem list:  1. Coronary artery disease: Status post PTCA and stenting of the RCA in 2000. Who placed a 4.0 x 18 mm NIR Royal post dilated up to 4.4 mm 2. Hyperlipidemia 3. Hypertension 4. Prostate cancer   77 yo with cad.  No chest pain.  Works out every day.  Had knee surgery several months ago.    Sept. 12, 2014:  No angina.  Has had some BP issues - up and down.   Typical readings are 130 / 85.  Lots of anxiety at night.   He has been going to the [facility].    Sept. 17, 2015:  Doing well.  No dyspnea, no CP,  Fatigues eaily.   Working out at [employer].  Sept. 27, 2016:  Doing well.  No CP ,  BP is a bit higher today . Typically is i the normal range.   Jan. 25, 2018:   Rush Landmark is doing well.   Has not been exercising as much.  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  Wears him out mentally - is not getting much exercise.  BP has been good   Apr 21, 2018:  Rush Landmark is seen today for follow-up visit.  He has a history of coronary artery disease with stenting. No CP or dyspnea is not exercising , Needs to get back into that  Is driving for [employer]  - not much time for walking   Current Outpatient Medications on File Prior to Visit  Medication Sig Dispense Refill  . aspirin 81 MG EC tablet Take 81 mg by mouth daily.      Marland Kitchen atorvastatin (LIPITOR) 80 MG tablet TAKE ONE TABLET BY MOUTH ONCE DAILY AT 6PM 90 tablet 0  . Coenzyme Q10 (CO Q 10 PO) Take by mouth daily.    . metoprolol tartrate (LOPRESSOR) 50 MG tablet TAKE 1/2 (ONE-HALF) TABLET BY MOUTH TWICE DAILY 90 tablet 0  . nitroGLYCERIN (NITROSTAT) 0.4 MG SL tablet One tablet under tongue up to three times if pain continues after 3 doses call 911 25 tablet 3  . Omega-3 Fatty Acids (FISH OIL  PO) Take by mouth daily. (Total 5,000)     No current facility-administered medications on file prior to visit.     No Known Allergies  Past Medical History:  Diagnosis Date  . Adenocarcinoma of prostate ([place])    Clinical stage T1C adenocarcinoma  . Arthritis   . Chicken pox   . Coronary artery disease    status post PTCA and stenting of his right coronary artery in 2000  . Frequent headaches   . Heart disease   . History of myocardial infarction 07/15/1999   cardiac catherization revealed a tight 99% stenosis in mid right coronary artery -- with successful PTCA and stenting of mid right  . History of prostate cancer   . Hyperlipidemia   . Hypertension   . Kidney stones     Past Surgical History:  Procedure Laterality Date  . APPENDECTOMY    . CORONARY ANGIOPLASTY WITH STENT PLACEMENT  2000   stenting of RCA  . RETINAL  DETACHMENT REPAIR W/ SCLERAL BUCKLE LE  06/09/02   Retinal detachment, right eye  . ROBOT ASSISTED LAPAROSCOPIC RADICAL PROSTATECTOMY  07/06/2006   Clinical stage T1C adenocarcinoma of the prostate  . UMBILICAL HERNIA REPAIR  10/17/2004    Social History   Tobacco Use  Smoking Status Former Smoker  . Last attempt to quit: 10/22/2005  . Years since quitting: 12.5  Smokeless Tobacco Never Used    Social History   Substance and Sexual Activity  Alcohol Use No    Family History  Problem Relation Age of Onset  . Heart attack Father 24  . Tuberculosis Father   . Heart failure Father   . Cirrhosis Mother   . Coronary artery disease Sister        with stents    Reviw of Systems:  Reviewed in the HPI.  All other systems are negative.  Physical Exam: Blood pressure (!) 140/92, pulse (!) 59, height 6\' 1"  (1.854 m), weight 188 lb 2.1 oz (85.3 kg), SpO2 94 %.  GEN:  Well nourished, well developed in no acute distress HEENT: Normal NECK: No JVD; No carotid bruits LYMPHATICS: No lymphadenopathy CARDIAC: RRR  RESPIRATORY:  Clear to auscultation  without rales, wheezing or rhonchi  ABDOMEN: Soft, non-tender, non-distended MUSCULOSKELETAL:  No edema; No deformity  SKIN: Warm and dry NEUROLOGIC:  Alert and oriented x 3   ECG: Apr 21, 2018: Sinus bradycardia at heart rate of 59.  First-degree AV block.  Incomplete right bundle branch block.  Minimal criteria for left ventricular hypertrophy.  Assessment / Plan:   1. Coronary artery disease: Status post PTCA and stenting of the RCA in 2000. Who placed a 4.0 x 18 mm NIR Royal post dilated up to 4.4 mm No angina  He needs to get back into exercising.  2. Hyperlipidemia -    Check labs today  To new atorvastatin 80 mg a day.  3. Hypertension-blood pressure is well controlled.  Continue current meds.  4. Prostate cancer    Mertie Moores, MD  04/21/2018 11:17 AM    [address] [place],  [place], [place]  [address] Pager [phone] Phone: [phone]; Fax: [phone]

## 2018-04-21 NOTE — Patient Instructions (Signed)

## 2018-04-22 LAB — BASIC METABOLIC PANEL
BUN / CREAT RATIO: 12 (ref 10–24)
BUN: 10 mg/dL (ref 8–27)
CALCIUM: 9.1 mg/dL (ref 8.6–10.2)
CHLORIDE: 106 mmol/L (ref 96–106)
CO2: 22 mmol/L (ref 20–29)
Creatinine, Ser: 0.81 mg/dL (ref 0.76–1.27)
GFR calc Af Amer: 100 mL/min/{1.73_m2} (ref >59)
GFR calc non Af Amer: 86 mL/min/{1.73_m2} (ref >59)
GLUCOSE: 95 mg/dL (ref 65–99)
Potassium: 4.3 mmol/L (ref 3.5–5.2)
Sodium: 143 mmol/L (ref 134–144)

## 2018-04-22 LAB — LIPID PANEL
CHOL/HDL RATIO: 2.7 ratio (ref 0.0–5.0)
Cholesterol, Total: 126 mg/dL (ref 100–199)
HDL: 47 mg/dL (ref >39)
LDL Calculated: 63 mg/dL (ref 0–99)
Triglycerides: 79 mg/dL (ref 0–149)
VLDL Cholesterol Cal: 16 mg/dL (ref 5–40)

## 2018-04-22 LAB — HEPATIC FUNCTION PANEL
ALBUMIN: 4.5 g/dL (ref 3.5–4.8)
ALT: 18 IU/L (ref 0–44)
AST: 20 IU/L (ref 0–40)
Alkaline Phosphatase: 94 IU/L (ref 39–117)
BILIRUBIN TOTAL: 0.5 mg/dL (ref 0.0–1.2)
BILIRUBIN, DIRECT: 0.14 mg/dL (ref 0.00–0.40)
TOTAL PROTEIN: 6.4 g/dL (ref 6.0–8.5)

## 2018-05-06 DIAGNOSIS — N3001 Acute cystitis with hematuria: Secondary | ICD-10-CM | POA: Diagnosis not present

## 2018-05-10 ENCOUNTER — Other Ambulatory Visit: Payer: Self-pay | Admitting: Cardiovascular Disease

## 2018-06-15 ENCOUNTER — Other Ambulatory Visit: Payer: Self-pay | Admitting: Cardiovascular Disease

## 2018-06-16 ENCOUNTER — Ambulatory Visit (INDEPENDENT_AMBULATORY_CARE_PROVIDER_SITE_OTHER): Payer: PPO | Admitting: Ophthalmology

## 2018-12-05 ENCOUNTER — Encounter (HOSPITAL_BASED_OUTPATIENT_CLINIC_OR_DEPARTMENT_OTHER): Payer: Self-pay | Admitting: *Deleted

## 2018-12-05 ENCOUNTER — Other Ambulatory Visit: Payer: Self-pay

## 2018-12-05 ENCOUNTER — Emergency Department (HOSPITAL_BASED_OUTPATIENT_CLINIC_OR_DEPARTMENT_OTHER)
Admission: EM | Admit: 2018-12-05 | Discharge: 2018-12-05 | Disposition: A | Discharge status: Home / Self Care | Payer: PPO | Attending: Emergency Medicine | Admitting: Emergency Medicine

## 2018-12-05 ENCOUNTER — Emergency Department (HOSPITAL_BASED_OUTPATIENT_CLINIC_OR_DEPARTMENT_OTHER): Payer: PPO

## 2018-12-05 DIAGNOSIS — Z87891 Personal history of nicotine dependence: Secondary | ICD-10-CM | POA: Diagnosis not present

## 2018-12-05 DIAGNOSIS — T07XXXA Unspecified multiple injuries, initial encounter: Secondary | ICD-10-CM

## 2018-12-05 DIAGNOSIS — S0003XA Contusion of scalp, initial encounter: Secondary | ICD-10-CM | POA: Diagnosis not present

## 2018-12-05 DIAGNOSIS — S60512A Abrasion of left hand, initial encounter: Secondary | ICD-10-CM | POA: Diagnosis not present

## 2018-12-05 DIAGNOSIS — S0990XA Unspecified injury of head, initial encounter: Secondary | ICD-10-CM | POA: Diagnosis present

## 2018-12-05 DIAGNOSIS — I10 Essential (primary) hypertension: Secondary | ICD-10-CM | POA: Insufficient documentation

## 2018-12-05 DIAGNOSIS — Y999 Unspecified external cause status: Secondary | ICD-10-CM | POA: Insufficient documentation

## 2018-12-05 DIAGNOSIS — Z7982 Long term (current) use of aspirin: Secondary | ICD-10-CM | POA: Diagnosis not present

## 2018-12-05 DIAGNOSIS — S01511A Laceration without foreign body of lip, initial encounter: Secondary | ICD-10-CM | POA: Insufficient documentation

## 2018-12-05 DIAGNOSIS — I2510 Atherosclerotic heart disease of native coronary artery without angina pectoris: Secondary | ICD-10-CM | POA: Insufficient documentation

## 2018-12-05 DIAGNOSIS — W01198A Fall on same level from slipping, tripping and stumbling with subsequent striking against other object, initial encounter: Secondary | ICD-10-CM | POA: Diagnosis not present

## 2018-12-05 DIAGNOSIS — Z79899 Other long term (current) drug therapy: Secondary | ICD-10-CM | POA: Diagnosis not present

## 2018-12-05 DIAGNOSIS — Z8546 Personal history of malignant neoplasm of prostate: Secondary | ICD-10-CM | POA: Insufficient documentation

## 2018-12-05 DIAGNOSIS — W19XXXA Unspecified fall, initial encounter: Secondary | ICD-10-CM

## 2018-12-05 DIAGNOSIS — Y929 Unspecified place or not applicable: Secondary | ICD-10-CM | POA: Insufficient documentation

## 2018-12-05 DIAGNOSIS — Y9389 Activity, other specified: Secondary | ICD-10-CM | POA: Diagnosis not present

## 2018-12-05 DIAGNOSIS — S0031XA Abrasion of nose, initial encounter: Secondary | ICD-10-CM | POA: Diagnosis not present

## 2018-12-05 DIAGNOSIS — S60511A Abrasion of right hand, initial encounter: Secondary | ICD-10-CM | POA: Insufficient documentation

## 2018-12-05 MED ORDER — CYCLOBENZAPRINE HCL 10 MG PO TABS: 10.0000 mg | ORAL_TABLET | Freq: Two times a day (BID) | ORAL | 0 refills | Status: AC | PRN

## 2018-12-05 NOTE — ED Notes (Signed)
Patient verbalizes understanding of discharge instructions. Opportunity for questioning and answers were provided. Armband removed by staff, pt discharged from ED home via POV with family. 

## 2018-12-05 NOTE — ED Notes (Signed)
Pt reports he fell trying to get out of the way from a rear lift hatch on a mini-van. Pt denies LOC. Pt reports he is having pain to his upper jaw, lip, left lower eye socket, and left eye brow area. Pt denies being on blood thinners.

## 2018-12-05 NOTE — ED Notes (Signed)
Pt skin tears to nose/upper lip/ bilat wrists/ and fingers cleansed. Xeroform applied. Bacitracin applied to smaller abrasions. Pt provided wound care instructions. Pt and pt wife voiced understanding. No questions or concerns at this time.

## 2018-12-05 NOTE — ED Triage Notes (Signed)
Pt reports he tripped and fell this evening, landing on concrete. Abrasions noted to nose, upper lip, and both wrists. Denies LOC. Ambulated to triage without need for assist

## 2018-12-05 NOTE — ED Provider Notes (Signed)
[facility] DEPARTMENT Provider Note   CSN: [id number] Arrival date & time: 12/05/18  1922     History   Chief Complaint Chief Complaint  Patient presents with  . Fall    HPI Vincent Brock is a 77 y.o. male.  HPI   Was using powerlift gate on back of van and door came down and took a quick step to get out of the way and stumbled, and fell scratched face and hands.  No LOC.  No nausea, no vomiting, no headache.  Neck hurting now, didn't hurt initially. Feels like muscle, hurting along the side. Bit tongue and lips, skinned face.  Had tetanus shot 2 years ago.  No numbness or weakness.  Has sore places on hands from abrasions.  No nose pain, had blood from nose but thinks was from outside.    Past Medical History:  Diagnosis Date  . 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 Arthritis   . Chicken pox   . Coronary artery disease    status post PTCA and stenting of his right coronary artery in 2000  . Frequent headaches   . Heart disease   . History of myocardial infarction 07/15/1999   cardiac catherization revealed a tight 99% stenosis in mid right coronary artery -- with successful PTCA and stenting of mid right  . History of prostate cancer   . Hyperlipidemia   . Hypertension   . Kidney stones     Patient Active Problem List   Diagnosis Date Noted  . Need for shingles vaccine 03/22/2016  . HTN (hypertension) 09/02/2013  . Hyperlipidemia 09/02/2013  . CAD (coronary artery disease) 06/26/2011    Past Surgical History:  Procedure Laterality Date  . APPENDECTOMY    . CORONARY ANGIOPLASTY WITH STENT PLACEMENT  2000   stenting of RCA  . RETINAL DETACHMENT REPAIR W/ SCLERAL BUCKLE LE  06/09/02   Retinal detachment, right eye  . ROBOT ASSISTED LAPAROSCOPIC RADICAL PROSTATECTOMY  07/06/2006   Clinical stage T1C adenocarcinoma of the prostate  . UMBILICAL HERNIA REPAIR  10/17/2004        Home Medications     Prior to Admission medications   Medication Sig Start Date End Date Taking? Authorizing Provider  aspirin 81 MG EC tablet Take 81 mg by mouth daily.      [provider]  atorvastatin (LIPITOR) 80 MG tablet TAKE 1 TABLET BY MOUTH ONCE DAILY 6 IN THE EVENING 05/11/18   Nahser, Wonda Cheng, MD  Coenzyme Q10 (CO Q 10 PO) Take by mouth daily.    [provider]  cyclobenzaprine (FLEXERIL) 10 MG tablet Take 1 tablet (10 mg total) by mouth 2 (two) times daily as needed for muscle spasms. 12/05/18   Gareth Morgan, MD  metoprolol tartrate (LOPRESSOR) 50 MG tablet TAKE 1/2 (ONE-HALF) TABLET BY MOUTH TWICE DAILY 06/15/18   Nahser, Wonda Cheng, MD  nitroGLYCERIN (NITROSTAT) 0.4 MG SL tablet One tablet under tongue up to three times if pain continues after 3 doses call 911 03/22/16   Carollee Herter, Alferd Apa, DO  Omega-3 Fatty Acids (FISH OIL PO) Take by mouth daily. (Total 5,000)    [provider]    Family History Family History  Problem Relation Age of Onset  . Heart attack Father 38  . Tuberculosis Father   . Heart failure Father   . Cirrhosis Mother   . Coronary artery disease Sister  with stents    Social History Social History   Tobacco Use  . Smoking status: Former Smoker    Last attempt to quit: 10/22/2005    Years since quitting: 13.1  . Smokeless tobacco: Never Used  Substance Use Topics  . Alcohol use: No  . Drug use: No     Allergies   Codeine   Review of Systems Review of Systems  Constitutional: Negative for fever.  HENT: Negative for nosebleeds (thinks coming from outside wound) and sore throat.   Eyes: Negative for visual disturbance.  Respiratory: Negative for shortness of breath.   Cardiovascular: Negative for chest pain.  Gastrointestinal: Negative for abdominal pain, nausea and vomiting.  Genitourinary: Negative for difficulty urinating.  Musculoskeletal: Negative for back pain and neck stiffness.  Skin: Positive for wound.  Negative for rash.  Neurological: Negative for syncope and headaches.     Physical Exam Updated Vital Signs BP (!) 151/85   Pulse 81   Temp 98.4 F (36.9 C) (Oral)   Resp 18   Ht 6\' 1"  (1.854 m)   Wt 88.5 kg   SpO2 97%   BMI 25.73 kg/m   Physical Exam Vitals signs and nursing note reviewed.  Constitutional:      General: He is not in acute distress.    Appearance: He is well-developed. He is not diaphoretic.  HENT:     Head: Normocephalic. No raccoon eyes, Battle's sign, contusion or masses.     Jaw: There is normal jaw occlusion. No malocclusion.     Comments: Abrasion to nose, superior to upper lip deep abrasion Hematoma to left superior orbita with tenderness No maxillary or mandibular tenderness. No nasal septal hematomas or nasal bone tenderness Small intraoral laceration upper lip  Eyes:     Conjunctiva/sclera: Conjunctivae normal.  Neck:     Musculoskeletal: Normal range of motion.  Cardiovascular:     Rate and Rhythm: Normal rate and regular rhythm.     Heart sounds: Normal heart sounds. No murmur. No friction rub. No gallop.   Pulmonary:     Effort: Pulmonary effort is normal. No respiratory distress.     Breath sounds: Normal breath sounds. No wheezing or rales.  Abdominal:     General: There is no distension.     Palpations: Abdomen is soft.     Tenderness: There is no abdominal tenderness. There is no guarding.  Musculoskeletal:     Cervical back: He exhibits tenderness (bilateral paraspinal). He exhibits no bony tenderness.     Right hand: He exhibits normal range of motion and no bony tenderness.     Left hand: He exhibits laceration. He exhibits normal range of motion and no bony tenderness.  Skin:    General: Skin is warm and dry.  Neurological:     Mental Status: He is alert and oriented to person, place, and time.      ED Treatments / Results  Labs (all labs ordered are listed, but only abnormal results are displayed) Labs Reviewed - No  data to display  EKG None  Radiology Ct Head Wo Contrast  Result Date: 12/05/2018 CLINICAL DATA:  Fall trying to get out of the way from a rear hatch on a minivan striking face on concrete. EXAM: CT HEAD WITHOUT CONTRAST CT MAXILLOFACIAL WITHOUT CONTRAST TECHNIQUE: Multidetector CT imaging of the head and maxillofacial structures were performed using the standard protocol without intravenous contrast. Multiplanar CT image reconstructions of the maxillofacial structures were also generated. COMPARISON:  None.  FINDINGS: CT HEAD FINDINGS Brain: Brain volume is normal for age. Mild periventricular white matter changes typically seen with chronic small vessel ischemia. No intracranial hemorrhage, mass effect, or midline shift. No hydrocephalus. The basilar cisterns are patent. No evidence of territorial infarct or acute ischemia. No extra-axial or intracranial fluid collection. Vascular: Atherosclerosis of skullbase vasculature without hyperdense vessel or abnormal calcification. Skull: No fracture or focal lesion. Other: Left frontal scalp hematoma. CT MAXILLOFACIAL FINDINGS Osseous: No acute fracture of the zygomatic arches, nasal bones, or mandibles. Temporomandibular joints are congruent with degenerative change bilaterally, worse on the right. Patient is edentulous of upper teeth. Orbits: Prior ocular surgery with probable scleral banding on the right and bilateral cataract resection. No evidence of acute globe injury. No acute orbital fracture. Sinuses: Postsurgical change of bilateral maxillary sinuses with diffuse mucosal thickening on the right, lesser mucosal thickening on the left. Circumferential mucosal thickening of the sphenoid sinuses, with scattered opacification of ethmoid air cells. No evidence of sinus fracture. The mastoid air cells are clear. Soft tissues: Left supraorbital soft tissue contusion. No soft tissue foreign body. IMPRESSION: 1. Left frontal scalp hematoma. No acute intracranial  abnormality. No skull fracture. 2. Left supraorbital soft tissue edema without orbital or acute facial bone fracture. Electronically Signed   By: Keith Rake M.D.   On: 12/05/2018 22:29   Ct Maxillofacial Wo Contrast  Result Date: 12/05/2018 CLINICAL DATA:  Fall trying to get out of the way from a rear hatch on a minivan striking face on concrete. EXAM: CT HEAD WITHOUT CONTRAST CT MAXILLOFACIAL WITHOUT CONTRAST TECHNIQUE: Multidetector CT imaging of the head and maxillofacial structures were performed using the standard protocol without intravenous contrast. Multiplanar CT image reconstructions of the maxillofacial structures were also generated. COMPARISON:  None. FINDINGS: CT HEAD FINDINGS Brain: Brain volume is normal for age. Mild periventricular white matter changes typically seen with chronic small vessel ischemia. No intracranial hemorrhage, mass effect, or midline shift. No hydrocephalus. The basilar cisterns are patent. No evidence of territorial infarct or acute ischemia. No extra-axial or intracranial fluid collection. Vascular: Atherosclerosis of skullbase vasculature without hyperdense vessel or abnormal calcification. Skull: No fracture or focal lesion. Other: Left frontal scalp hematoma. CT MAXILLOFACIAL FINDINGS Osseous: No acute fracture of the zygomatic arches, nasal bones, or mandibles. Temporomandibular joints are congruent with degenerative change bilaterally, worse on the right. Patient is edentulous of upper teeth. Orbits: Prior ocular surgery with probable scleral banding on the right and bilateral cataract resection. No evidence of acute globe injury. No acute orbital fracture. Sinuses: Postsurgical change of bilateral maxillary sinuses with diffuse mucosal thickening on the right, lesser mucosal thickening on the left. Circumferential mucosal thickening of the sphenoid sinuses, with scattered opacification of ethmoid air cells. No evidence of sinus fracture. The mastoid air cells  are clear. Soft tissues: Left supraorbital soft tissue contusion. No soft tissue foreign body. IMPRESSION: 1. Left frontal scalp hematoma. No acute intracranial abnormality. No skull fracture. 2. Left supraorbital soft tissue edema without orbital or acute facial bone fracture. Electronically Signed   By: Keith Rake M.D.   On: 12/05/2018 22:29    Procedures Procedures (including critical care time)  Medications Ordered in ED Medications - No data to display   Initial Impression / Assessment and Plan / ED Course  I have reviewed the triage vital signs and the nursing notes.  Pertinent labs & imaging results that were available during my care of the patient were reviewed by me and considered in my  medical decision making (see chart for details).    77yo male with history above presents with concern for mechanical fall this evening and facial abrasions.  CT head and face show no evidence of fracture or intracranial bleed.  No hand pain or tenderness, has abrasions, no snuff box tenderness.  Regarding abrasions to face, injuries not amenable to suture or repair.  Irrigated and xeroform placed.  After waiting in the ED, patient began to develop neck pain. No midline tenderness, no numbness, no weakness, no distracting injuries, not intoxicated, did not have any neck pain initially and have low suspicion for fracture. Did discuss with patient given his age despite the above, we can obtain CT imaging of his spine, however he declines at this time.  Discussed reasons to return in detail. Discussed wound care. Patient discharged in stable condition with understanding of reasons to return.   Final Clinical Impressions(s) / ED Diagnoses   Final diagnoses:  Multiple abrasions  Laceration of intraoral surface of lip, initial encounter  Fall, initial encounter    ED Discharge Orders         Ordered    cyclobenzaprine (FLEXERIL) 10 MG tablet  2 times daily PRN     12/05/18 2322             Gareth Morgan, MD 12/06/18 0216

## 2018-12-07 ENCOUNTER — Ambulatory Visit: Payer: Self-pay | Admitting: *Deleted

## 2018-12-07 NOTE — Telephone Encounter (Signed)
Pt had a fall on Sunday,on his face. He had cuts and scrapes on his face and hands. He went to the ED to be assessed. Denies having any other injuries at the time.  He did have a CT of his head done. He is calling today because now he is having pain at the right rib cage. It seems to be spreading towards the left side. No bruising, swelling or cuts noted. Appointment scheduled per protocol. Home care advice given to him, try taking Tylenol or ibuprofen (if he can take it) for the pain. Applying some heat to the site may help. Call back for increase in symptoms. Pt voiced understanding. Routing to flow at [facility] at [facility].  Reason for Disposition . Nursing judgment or information in reference  Answer Assessment - Initial Assessment Questions 1. REASON FOR CALL: "What is your main concern right now?"     Having right rib cage pain after falling on Sunday 2. ONSET: "When did the pain start?"     Sunday 3. SEVERITY: "How bad is the pain?"     3 or 4, up to 7 when moving 4. FEVER: "Do you have a fever?"     no 5. OTHER SYMPTOMS: "Do you have any other new symptoms?"     no 6. INTERVENTIONS AND RESPONSE: "What have you done so far to try to make this better? What medications have you used?"     Trying not to move much. 7. PREGNANCY: "Is there any chance you are pregnant?"     n/a  Protocols used: NO GUIDELINE AVAILABLE-A-AH

## 2018-12-08 ENCOUNTER — Encounter: Payer: Self-pay | Admitting: Medical

## 2018-12-08 ENCOUNTER — Ambulatory Visit (INDEPENDENT_AMBULATORY_CARE_PROVIDER_SITE_OTHER): Payer: PPO | Admitting: Medical

## 2018-12-08 VITALS — BP 141/90 | HR 58 | Temp 97.7°F | Resp 16 | Ht 73.0 in | Wt 192.0 lb

## 2018-12-08 DIAGNOSIS — L089 Local infection of the skin and subcutaneous tissue, unspecified: Secondary | ICD-10-CM | POA: Diagnosis not present

## 2018-12-08 DIAGNOSIS — R0781 Pleurodynia: Secondary | ICD-10-CM

## 2018-12-08 DIAGNOSIS — J3489 Other specified disorders of nose and nasal sinuses: Secondary | ICD-10-CM | POA: Diagnosis not present

## 2018-12-08 DIAGNOSIS — T148XXA Other injury of unspecified body region, initial encounter: Secondary | ICD-10-CM | POA: Diagnosis not present

## 2018-12-08 MED ORDER — DOXYCYCLINE HYCLATE 100 MG PO TABS: 100.0000 mg | ORAL_TABLET | Freq: Two times a day (BID) | ORAL | 0 refills | Status: DC

## 2018-12-08 NOTE — Patient Instructions (Signed)
Your skin abrasions on your left thumb and right wrist looks like they are healing appropriately with no secondary infection.  However the small abrasion/laceration at the tip of your nose has some surrounding redness and the bandage had a yellowish colored discharge.  Some concern for skin infection.  Also history of left maxillary sinus infections remotely when you were younger and mucosal thickening on the CT.  No fracture was seen on facial bone CT.  I am going to prescribe doxycycline for both probable skin infection and for potential sinus infection.  If you have recurrent constant sinus pressure as before when you were younger then will refer back to ENT.  You had had some minimal right rib region pain.  Hardly any on exam presently.  Offered x-ray x-ray of ribs but you declined today.  If you change your mind or rib pain worsens then let me know and I will put in that rib x-ray order.  Follow-up in 10 to 14 days or as needed.

## 2018-12-08 NOTE — Progress Notes (Signed)
Subjective:    Patient ID: Vincent Brock, male    DOB: [birth year], 77 y.o.   MRN: [id number]  HPI   Pt states on Sunday he was getting vacuum out of van. The [employer] door was coming down and he was rushing to clear the door. He bumped his head on door and got knocked to the ground. He fell face first  No loc. He suffered abrasion to nose and hands.   Monday afternoon her started to get some rt lower rib area pain. Pain worsened during the day. Pain initially on twisting thorax yesterday morning. Pain eased up by last night. No bruise to rt rib.  Pt did decline ct of neck in the emergency dept. Not reporting neck pain since fall.  Pt evaluated in ED. He had negative CT of head. No fx either on ct facial bones.  See below assessment and plan  "77yo male with history above presents with concern for mechanical fall this evening and facial abrasions.  CT head and face show no evidence of fracture or intracranial bleed.  No hand pain or tenderness, has abrasions, no snuff box tenderness.  Regarding abrasions to face, injuries not amenable to suture or repair.  Irrigated and xeroform placed.  After waiting in the ED, patient began to develop neck pain. No midline tenderness, no numbness, no weakness, no distracting injuries, not intoxicated, did not have any neck pain initially and have low suspicion for fracture. Did discuss with patient given his age despite the above, we can obtain CT imaging of his spine, however he declines at this time.  Discussed reasons to return in detail. Discussed wound care. Patient discharged in stable condition with understanding of reasons to return."   Review of Systems  Constitutional: Negative for chills, fatigue and fever.  HENT: Positive for sinus pressure. Negative for congestion, sinus pain and sore throat.   Respiratory: Negative for cough, chest tightness, shortness of breath and wheezing.   Cardiovascular: Negative for chest pain and palpitations.    Gastrointestinal: Negative for abdominal pain.  Musculoskeletal: Negative for back pain.  Skin:       Skin abrasion.  Healing laceration to left distal nares.  Neurological: Negative for dizziness, syncope, weakness, numbness and headaches.  Hematological: Negative for adenopathy. Does not bruise/bleed easily.  Psychiatric/Behavioral: Negative for behavioral problems and decreased concentration.       Objective:   Physical Exam  General- No acute distress. Pleasant patient. Neck- Full range of motion, no jvd Lungs- Clear, even and unlabored. Heart- regular rate and rhythm. Neurologic- CNII- XII grossly intact.  Skin- left base of thumb small abrasion. No infection. Rt wrist small healing abrasion. Nose- left distal nostril scab present. Some scab present. Redness around scab. Yellow dc on bandage. Face- faint left sinus area tender adjacent to left nostril(The ct showed mucosal thickening)  Rt rib pain- mil pain on palpation rt rib mid axillary region.     Assessment & Plan:  Your skin abrasions on your left thumb and right wrist looks like they are healing appropriately with no secondary infection.  However the small abrasion/laceration at the tip of your nose has some surrounding redness and the bandage had a yellowish colored discharge.  Some concern for skin infection.  Also history of left maxillary sinus infections remotely when you were younger and mucosal thickening on the CT.  No fracture was seen on facial bone CT.  I am going to prescribe doxycycline for both probable skin infection and  for potential sinus infection.  If you have recurrent constant sinus pressure as before when you were younger then will refer back to ENT.  You had had some minimal right rib region pain.  Hardly any on exam presently.  Offered x-ray x-ray of ribs but you declined today.  If you change your mind or rib pain worsens then let me know and I will put in that rib x-ray order.  Follow-up in  10 to 14 days or as needed.  Mackie Pai, PA-C

## 2018-12-13 ENCOUNTER — Emergency Department (HOSPITAL_BASED_OUTPATIENT_CLINIC_OR_DEPARTMENT_OTHER): Payer: PPO

## 2018-12-13 ENCOUNTER — Encounter: Payer: Self-pay | Admitting: Medical

## 2018-12-13 ENCOUNTER — Other Ambulatory Visit: Payer: Self-pay

## 2018-12-13 ENCOUNTER — Ambulatory Visit (INDEPENDENT_AMBULATORY_CARE_PROVIDER_SITE_OTHER): Payer: PPO | Admitting: Medical

## 2018-12-13 ENCOUNTER — Encounter (HOSPITAL_BASED_OUTPATIENT_CLINIC_OR_DEPARTMENT_OTHER): Payer: Self-pay

## 2018-12-13 ENCOUNTER — Emergency Department (HOSPITAL_BASED_OUTPATIENT_CLINIC_OR_DEPARTMENT_OTHER)
Admission: EM | Admit: 2018-12-13 | Discharge: 2018-12-13 | Disposition: A | Discharge status: Home / Self Care | Payer: PPO | Attending: Emergency Medicine | Admitting: Emergency Medicine

## 2018-12-13 ENCOUNTER — Ambulatory Visit: Payer: Self-pay | Admitting: *Deleted

## 2018-12-13 VITALS — BP 170/90 | HR 58 | Temp 97.6°F | Resp 16 | Ht 73.0 in | Wt 192.8 lb

## 2018-12-13 DIAGNOSIS — W19XXXD Unspecified fall, subsequent encounter: Secondary | ICD-10-CM | POA: Insufficient documentation

## 2018-12-13 DIAGNOSIS — Z7982 Long term (current) use of aspirin: Secondary | ICD-10-CM | POA: Diagnosis not present

## 2018-12-13 DIAGNOSIS — Z87891 Personal history of nicotine dependence: Secondary | ICD-10-CM | POA: Insufficient documentation

## 2018-12-13 DIAGNOSIS — I10 Essential (primary) hypertension: Secondary | ICD-10-CM

## 2018-12-13 DIAGNOSIS — Y9389 Activity, other specified: Secondary | ICD-10-CM | POA: Diagnosis not present

## 2018-12-13 DIAGNOSIS — M940 Chondrocostal junction syndrome [Tietze]: Secondary | ICD-10-CM

## 2018-12-13 DIAGNOSIS — Y999 Unspecified external cause status: Secondary | ICD-10-CM | POA: Insufficient documentation

## 2018-12-13 DIAGNOSIS — Z955 Presence of coronary angioplasty implant and graft: Secondary | ICD-10-CM | POA: Insufficient documentation

## 2018-12-13 DIAGNOSIS — Z8546 Personal history of malignant neoplasm of prostate: Secondary | ICD-10-CM | POA: Diagnosis not present

## 2018-12-13 DIAGNOSIS — R0789 Other chest pain: Secondary | ICD-10-CM

## 2018-12-13 DIAGNOSIS — R0781 Pleurodynia: Secondary | ICD-10-CM | POA: Diagnosis not present

## 2018-12-13 DIAGNOSIS — Y9289 Other specified places as the place of occurrence of the external cause: Secondary | ICD-10-CM | POA: Insufficient documentation

## 2018-12-13 DIAGNOSIS — S299XXA Unspecified injury of thorax, initial encounter: Secondary | ICD-10-CM | POA: Diagnosis not present

## 2018-12-13 DIAGNOSIS — I2510 Atherosclerotic heart disease of native coronary artery without angina pectoris: Secondary | ICD-10-CM | POA: Insufficient documentation

## 2018-12-13 DIAGNOSIS — Z79899 Other long term (current) drug therapy: Secondary | ICD-10-CM | POA: Insufficient documentation

## 2018-12-13 LAB — CBC
HCT: 44.9 % (ref 39.0–52.0)
Hemoglobin: 14.3 g/dL (ref 13.0–17.0)
MCH: 30.4 pg (ref 26.0–34.0)
MCHC: 31.8 g/dL (ref 30.0–36.0)
MCV: 95.3 fL (ref 80.0–100.0)
NRBC: 0 % (ref 0.0–0.2)
Platelets: 215 10*3/uL (ref 150–400)
RBC: 4.71 MIL/uL (ref 4.22–5.81)
RDW: 14 % (ref 11.5–15.5)
WBC: 4.7 10*3/uL (ref 4.0–10.5)

## 2018-12-13 LAB — BASIC METABOLIC PANEL
Anion gap: 8 (ref 5–15)
BUN: 15 mg/dL (ref 8–23)
CO2: 25 mmol/L (ref 22–32)
Calcium: 8.9 mg/dL (ref 8.9–10.3)
Chloride: 106 mmol/L (ref 98–111)
Creatinine, Ser: 0.76 mg/dL (ref 0.61–1.24)
GFR calc Af Amer: >60 mL/min (ref >60)
GFR calc non Af Amer: >60 mL/min (ref >60)
Glucose, Bld: 89 mg/dL (ref 70–99)
Potassium: 3.6 mmol/L (ref 3.5–5.1)
Sodium: 139 mmol/L (ref 135–145)

## 2018-12-13 LAB — TROPONIN I: Troponin I: <0.03 ng/mL (ref <0.03)

## 2018-12-13 MED ORDER — MAGIC MOUTHWASH: ORAL | 0 refills | Status: DC

## 2018-12-13 MED ORDER — DICLOFENAC SODIUM 1 % TD GEL: 4.0000 g | Freq: Four times a day (QID) | TRANSDERMAL | 1 refills | Status: DC

## 2018-12-13 MED FILL — DICLOFENAC SODIUM 1 % GEL: 1 | 7 days supply | Qty: 100 | Fill #0

## 2018-12-13 MED FILL — MAGIC MOUTHWASH BOP FORM: 10 days supply | Qty: 200 | Fill #0

## 2018-12-13 NOTE — ED Provider Notes (Signed)
[facility] DEPARTMENT Provider Note   CSN: [id number] Arrival date & time: 12/13/18  1454     History   Chief Complaint No chief complaint on file.   HPI Vincent Brock is a 77 y.o. male.  Patient is a 77 year old male with a history of hypertension, hyperlipidemia, coronary artery disease status post stenting of the RCA in 2000 who is presenting today with complaints of chest discomfort.  He states approximately 9 days ago he was leaning over cleaning his car when the back door was coming down causing him to fall hitting his face on the concrete.  Since that time he had had some pain in the right lower chest which was worse with movement and deep breathing.  He was seen in the emergency room initially after the injury and wounds were taking care of.  He states over the last week the pain has been persistent.  It is only present with deep breathing, coughing or movement after he is still for a while.  He says yesterday while shaving he felt like the pain moved over to his left side and is been present now on his left side the same as it had been in his right side.  No radiation of the pain into the back or arms.  He denies any shortness of breath, fever, abdominal pain or vomiting.  He has not noticed any swelling in his legs.  He walks up 3 flights of stairs to go to his condo and he has been able to do that consistently without feeling more short of breath.  He went to see his doctor today and they recommended he come down here to make sure this was not cardiac.  The history is provided by the patient and the spouse.    Past Medical History:  Diagnosis Date  . 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 Arthritis   . Chicken pox   . Coronary artery disease    status post PTCA and stenting of his right coronary artery in 2000  . Frequent headaches   . Heart disease   . History of myocardial infarction 07/15/1999   cardiac  catherization revealed a tight 99% stenosis in mid right coronary artery -- with successful PTCA and stenting of mid right  . History of prostate cancer   . Hyperlipidemia   . Hypertension   . Kidney stones     Patient Active Problem List   Diagnosis Date Noted  . Need for shingles vaccine 03/22/2016  . HTN (hypertension) 09/02/2013  . Hyperlipidemia 09/02/2013  . CAD (coronary artery disease) 06/26/2011    Past Surgical History:  Procedure Laterality Date  . APPENDECTOMY    . CORONARY ANGIOPLASTY WITH STENT PLACEMENT  2000   stenting of RCA  . RETINAL DETACHMENT REPAIR W/ SCLERAL BUCKLE LE  06/09/02   Retinal detachment, right eye  . ROBOT ASSISTED LAPAROSCOPIC RADICAL PROSTATECTOMY  07/06/2006   Clinical stage T1C adenocarcinoma of the prostate  . UMBILICAL HERNIA REPAIR  10/17/2004        Home Medications    Prior to Admission medications   Medication Sig Start Date End Date Taking? Authorizing Provider  aspirin 81 MG EC tablet Take 81 mg by mouth daily.      [provider]  atorvastatin (LIPITOR) 80 MG tablet TAKE 1 TABLET BY MOUTH ONCE DAILY 6 IN THE EVENING 05/11/18   Nahser, Wonda Cheng, MD  Coenzyme Q10 (CO Q  10 PO) Take by mouth daily.    [provider]  cyclobenzaprine (FLEXERIL) 10 MG tablet Take 1 tablet (10 mg total) by mouth 2 (two) times daily as needed for muscle spasms. 12/05/18   Gareth Morgan, MD  doxycycline (VIBRA-TABS) 100 MG tablet Take 1 tablet (100 mg total) by mouth 2 (two) times daily. 12/08/18   Saguier, Percell Miller, PA-C  magic mouthwash SOLN 5 ml po qid swish and spit. 12/13/18   Saguier, Percell Miller, PA-C  metoprolol tartrate (LOPRESSOR) 50 MG tablet TAKE 1/2 (ONE-HALF) TABLET BY MOUTH TWICE DAILY 06/15/18   Nahser, Wonda Cheng, MD  nitroGLYCERIN (NITROSTAT) 0.4 MG SL tablet One tablet under tongue up to three times if pain continues after 3 doses call 911 03/22/16   Carollee Herter, Alferd Apa, DO  Omega-3 Fatty Acids (FISH OIL PO) Take by mouth  daily. (Total 5,000)    [provider]    Family History Family History  Problem Relation Age of Onset  . Heart attack Father 52  . Tuberculosis Father   . Heart failure Father   . Cirrhosis Mother   . Coronary artery disease Sister        with stents    Social History Social History   Tobacco Use  . Smoking status: Former Smoker    Last attempt to quit: 10/22/2005    Years since quitting: 13.1  . Smokeless tobacco: Never Used  Substance Use Topics  . Alcohol use: No  . Drug use: No     Allergies   Codeine   Review of Systems Review of Systems  All other systems reviewed and are negative.    Physical Exam Updated Vital Signs BP (!) 196/110 (BP Location: Right Arm)   Pulse 64   Temp 97.6 F (36.4 C) (Oral)   Resp 16   SpO2 98%   Physical Exam Vitals signs and nursing note reviewed.  Constitutional:      General: He is not in acute distress.    Appearance: He is well-developed.  HENT:     Head: Normocephalic and atraumatic.     Comments: Small ulcerations  On the midddle of the inner upper lip and lower lip.  No dental trauma.  Healing abrasions to the nose and upper lip on the outside Eyes:     Conjunctiva/sclera: Conjunctivae normal.     Pupils: Pupils are equal, round, and reactive to light.  Neck:     Musculoskeletal: Normal range of motion and neck supple.  Cardiovascular:     Rate and Rhythm: Normal rate and regular rhythm.     Heart sounds: No murmur.  Pulmonary:     Effort: Pulmonary effort is normal. No respiratory distress.     Breath sounds: Normal breath sounds. No wheezing or rales.  Chest:     Chest wall: Tenderness present. No mass, lacerations, deformity, swelling or crepitus.    Abdominal:     General: There is no distension.     Palpations: Abdomen is soft.     Tenderness: There is no abdominal tenderness. There is no guarding or rebound.  Musculoskeletal: Normal range of motion.        General: No tenderness.   Skin:    General: Skin is warm and dry.     Capillary Refill: Capillary refill takes less than 2 seconds.     Findings: No erythema or rash.     Comments: Healing abrasions over the hands  Neurological:     Mental Status: He is  alert and oriented to person, place, and time.  Psychiatric:        Behavior: Behavior normal.      ED Treatments / Results  Labs (all labs ordered are listed, but only abnormal results are displayed) Labs Reviewed  BASIC METABOLIC PANEL  CBC  TROPONIN I    EKG None  Radiology Dg Chest 2 View  Result Date: 12/13/2018 CLINICAL DATA:  Patient fell 1 week ago. Originally, the patient had right rib pain subsequently moving to the sternal area and left chest. EXAM: CHEST - 2 VIEW COMPARISON:  Radiographs 06/19/2011. FINDINGS: The heart size and mediastinal contours are stable. There is mild aortic tortuosity and a coronary artery stent. The lungs are hyperinflated but clear. There is no pleural effusion or pneumothorax. There is no evidence of acute fracture or retrosternal hematoma. There is a convex right thoracolumbar scoliosis. IMPRESSION: No evidence of active cardiopulmonary process or acute fracture. Electronically Signed   By: Richardean Sale M.D.   On: 12/13/2018 15:31    Procedures Procedures (including critical care time)  Medications Ordered in ED Medications - No data to display   Initial Impression / Assessment and Plan / ED Course  I have reviewed the triage vital signs and the nursing notes.  Pertinent labs & imaging results that were available during my care of the patient were reviewed by me and considered in my medical decision making (see chart for details).     Patient is a 77 year old male presenting today with atypical chest pain which seems to be musculoskeletal in nature.  It started approximately 9 days ago when he had a fall in the right side of his chest and is still present in the right chest but has moved to the left  chest as well.  It is only present with movement and deep breathing.  He has no other associated symptoms.  Patient does have a cardiac history with stent placement in 2000 and takes an aspirin daily but denies any exertional symptoms, shortness of breath or swelling.  Low suspicion for ACS, dissection, pneumothorax.  X-ray is negative for rib fracture. EKG done in office today is unchanged from prior.  Patient was seen by his PCP today and sent down here to rule out cardiac chest pain.  Will get 1 troponin however patient symptoms seem very reproducible.  No evidence of zoster.  Discussed with patient ongoing treatment with Tylenol and Voltaren gel as needed.  Labs wnl.  Pt d/ced home.  Final Clinical Impressions(s) / ED Diagnoses   Final diagnoses:  Chest wall pain    ED Discharge Orders         Ordered    diclofenac sodium (VOLTAREN) 1 % GEL  4 times daily     12/13/18 1659           Blanchie Dessert, MD 12/13/18 1700

## 2018-12-13 NOTE — Telephone Encounter (Signed)
Does edward have anything?

## 2018-12-13 NOTE — ED Triage Notes (Addendum)
Pt had a fall x1 week ago and pt was seen in this ED. Pt initially had R rib pain, a few days later pain "moved" to sternum, last night pain "moved" to L side. Pt states pain is worse with movement and inspiration. Pt denies ShOB, nausea, diaphoresis. Pt denies pain at the present time. Pt was seen upstairs by PCP and was sent down here for further evaluation. Pt has EKG on file that was taken 45 minutes PTA.

## 2018-12-13 NOTE — Patient Instructions (Addendum)
Your blood pressure is moderately high presently.  More so than it typically is on review of your office visits with cardiologist. ED may give additional bp med after treatment/eval.  You have recently had some atypical chest discomfort.  The manner that your pain came on is atypical.  Initially had mild right lower rib area pain that migrated to the mid sternal area followed by a pop and now pain on the left side of your chest.  Pain does seem consistent with musculoskeletal type pain but some concern due to your history of stents placed in 2000 as well as your history of MI.  Your EKG looks similar to previous EKGs.  First-degree AV block seen but possible subtle ST elevation in lead II and V5.  Your sinus pressure has improved and your skin abrasion areas appear improved with doxycycline.  You have some sore mouth lesions presently and I am going to give you Magic mouthwash to use 5 mL p.o. 4 times daily swish and spit.  I do think with your cardiac history and your atypical symptoms that it would be best to be evaluated in the emergency department.  I think imaging studies of your chest would be helpful as well as repeat EKG and at least one set of cardiac enzymes.  Follow-up date to be determined after ED evaluation.

## 2018-12-13 NOTE — Telephone Encounter (Signed)
Wife is calling to reports- mouth still swollen. Pain has moved to left in ribs- may need X-ray.  Tried to call office to see if patient can be worked into schedule to since not improving- not able to get through. Message sent for review and call back- [phone]  Reason for Disposition . [1] Caller has URGENT question AND [2] triager unable to answer question  Answer Assessment - Initial Assessment Questions 1. INFECTION: "What infection is the antibiotic being given for?"     Doxycycline- wounds from fall 2. ANTIBIOTIC: "What antibiotic are you taking" "How many times per day?"     Doxycycline 3. DURATION: "When was the antibiotic started?"     12/18 4. MAIN CONCERN OR SYMPTOM:  "What is your main concern right now?"     Pain in rib- pain with deep breath, swelling of mouth 5. BETTER-SAME-WORSE: "Are you getting better, staying the same, or getting worse compared to when you first started the antibiotics?" If getting worse, ask: "In what way?"      Seemed he was getting better- but since he has been moving- he seems to be worse 6. FEVER: "Do you have a fever?" If so, ask: "What is your temperature, how was it measured, and when did it start?"     no 7. SYMPTOMS: "Are there any other symptoms you're concerned about?" If so, ask: "When did it start?"     Increased pain, swelling not better- mouth seemed to be getting better- then not 8. FOLLOW-UP APPOINTMENT: "Do you have a follow-up appointment with your doctor?"     no  Protocols used: INFECTION ON ANTIBIOTIC FOLLOW-UP CALL-A-AH

## 2018-12-13 NOTE — Telephone Encounter (Signed)
No providers in office have openings. Please advise on recommended next steps.

## 2018-12-13 NOTE — Telephone Encounter (Signed)
Looks like I had one slot open. So I am seeing him today.

## 2018-12-13 NOTE — Progress Notes (Signed)
Subjective:    Patient ID: Vincent Brock, male    DOB: 10-09-1941, 77 y.o.   MRN: [id number]  HPI  Pt is in for follow up.  Pt abrasions on his forehead, nose, and and chin have healed well.   Pt tongue feels normal now. He states he had laceration inside his mouth. That area healed well.  He has some small blister in upper and lower lip area. These came up 2 days later. Very sore.  Pt sinus feel better.  Pt had some rt rib area pain as well. He had minimal pain and I offered/recommended xray but he declined. Since last saw him he states pain was in middle of chest. Last night he felt pop sensation sternum area. He states when he takes a deep breath the area hurts. Or when he moves thorax the area hurts.  Pt has nitroglycerin at home. But he has not had to use it.  Note hx of stent placed in 2000. Pt states around that time had mi.     Review of Systems  Constitutional: Negative for chills, fatigue and fever.  HENT: Negative for dental problem, sinus pressure and sinus pain.        See hpi.  Respiratory: Negative for cough, chest tightness, shortness of breath and wheezing.        See hpi. Atypical chest wall pain.  No shortness of breath on ambulation.  Cardiovascular: Negative for chest pain and palpitations.  Gastrointestinal: Negative for abdominal pain and blood in stool.  Musculoskeletal: Negative for back pain.       Chest wall pain. msk like/costochondritis.   No shoulder pain. No jaw pain.  Skin: Negative for rash.       Small blister on lips.  Neurological: Negative for facial asymmetry and headaches.  Hematological: Negative for adenopathy. Does not bruise/bleed easily.  Psychiatric/Behavioral: Negative for behavioral problems, confusion and suicidal ideas. The patient is not nervous/anxious.     Past Medical History:  Diagnosis Date  . 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 Arthritis   . Chicken pox   . Coronary  artery disease    status post PTCA and stenting of his right coronary artery in 2000  . Frequent headaches   . Heart disease   . History of myocardial infarction 07/15/1999   cardiac catherization revealed a tight 99% stenosis in mid right coronary artery -- with successful PTCA and stenting of mid right  . History of prostate cancer   . Hyperlipidemia   . Hypertension   . Kidney stones      Social History   Socioeconomic History  . Marital status: Married    Spouse name: Not on file  . Number of children: Not on file  . Years of education: Not on file  . Highest education level: Not on file  Occupational History  . Not on file  Social Needs  . Financial resource strain: Not on file  . Food insecurity:    Worry: Not on file    Inability: Not on file  . Transportation needs:    Medical: Not on file    Non-medical: Not on file  Tobacco Use  . Smoking status: Former Smoker    Last attempt to quit: 10/22/2005    Years since quitting: 13.1  . Smokeless tobacco: Never Used  Substance and Sexual Activity  . Alcohol use: No  . Drug use: No  . Sexual activity: Yes  Lifestyle  . Physical activity:    Days per week: Not on file    Minutes per session: Not on file  . Stress: Not on file  Relationships  . Social connections:    Talks on phone: Not on file    Gets together: Not on file    Attends religious service: Not on file    Active member of club or organization: Not on file    Attends meetings of clubs or organizations: Not on file    Relationship status: Not on file  . Intimate partner violence:    Fear of current or ex partner: Not on file    Emotionally abused: Not on file    Physically abused: Not on file    Forced sexual activity: Not on file  Other Topics Concern  . Not on file  Social History Narrative  . Not on file    Past Surgical History:  Procedure Laterality Date  . APPENDECTOMY    . CORONARY ANGIOPLASTY WITH STENT PLACEMENT  2000   stenting of  RCA  . RETINAL DETACHMENT REPAIR W/ SCLERAL BUCKLE LE  06/09/02   Retinal detachment, right eye  . ROBOT ASSISTED LAPAROSCOPIC RADICAL PROSTATECTOMY  07/06/2006   Clinical stage T1C adenocarcinoma of the prostate  . UMBILICAL HERNIA REPAIR  10/17/2004    Family History  Problem Relation Age of Onset  . Heart attack Father 66  . Tuberculosis Father   . Heart failure Father   . Cirrhosis Mother   . Coronary artery disease Sister        with stents    Allergies  Allergen Reactions  . Codeine Other (See Comments)    "can't relax or sleep"    Current Outpatient Medications on File Prior to Visit  Medication Sig Dispense Refill  . aspirin 81 MG EC tablet Take 81 mg by mouth daily.      Marland Kitchen atorvastatin (LIPITOR) 80 MG tablet TAKE 1 TABLET BY MOUTH ONCE DAILY 6 IN THE EVENING 90 tablet 3  . Coenzyme Q10 (CO Q 10 PO) Take by mouth daily.    . cyclobenzaprine (FLEXERIL) 10 MG tablet Take 1 tablet (10 mg total) by mouth 2 (two) times daily as needed for muscle spasms. 10 tablet 0  . doxycycline (VIBRA-TABS) 100 MG tablet Take 1 tablet (100 mg total) by mouth 2 (two) times daily. 20 tablet 0  . metoprolol tartrate (LOPRESSOR) 50 MG tablet TAKE 1/2 (ONE-HALF) TABLET BY MOUTH TWICE DAILY 90 tablet 3  . nitroGLYCERIN (NITROSTAT) 0.4 MG SL tablet One tablet under tongue up to three times if pain continues after 3 doses call 911 25 tablet 3  . Omega-3 Fatty Acids (FISH OIL PO) Take by mouth daily. (Total 5,000)     No current facility-administered medications on file prior to visit.     BP (!) 176/99   Pulse (!) 58   Temp 97.6 F (36.4 C) (Oral)   Resp 16   Ht 6\' 1"  (1.854 m)   Wt 192 lb 12.8 oz (87.5 kg)   SpO2 98%   BMI 25.44 kg/m      Objective:   Physical Exam  General  Mental Status - Alert. General Appearance - Well groomed. Not in acute distress.  Skin Rashes- No Rashes.  HEENT Head- Normal. Ear Auditory Canal - Left- Normal. Right - Normal.Tympanic Membrane- Left-  Normal. Right- Normal. Eye Sclera/Conjunctiva- Left- Normal. Right- Normal. Nose & Sinuses Nasal Mucosa- Left-  Boggy and Congested. Right-  Boggy and  Congested.Bilateral maxillary and frontal sinus pressure. Mouth & Throat Lips: Upper Lip- Normal: no dryness, cracking, pallor, cyanosis, or vesicular eruption. Lower Lip-Normal: no dryness, cracking, pallor, cyanosis or vesicular eruption. Buccal Mucosa- Bilateral- No Aphthous ulcers. Oropharynx- No Discharge or Erythema. Tonsils: Characteristics- Bilateral- No Erythema or Congestion. Size/Enlargement- Bilateral- No enlargement. Discharge- bilateral-None.  Neck Neck- Supple. No Masses.   Chest and Lung Exam Auscultation: Breath Sounds:-Clear even and unlabored.  Cardiovascular Auscultation:Rythm- Regular, rate and rhythm. Murmurs & Other Heart Sounds:Ausculatation of the heart reveal- No Murmurs.  Lymphatic Head & Neck General Head & Neck Lymphatics: Bilateral: Description- No Localized lymphadenopathy.  Lower ext- no calf swelling. No pedal edema. Negative homans signs.  Neuro- CN III- XII intact.     Assessment & Plan:  Your blood pressure is moderately high presently.  More so than it typically is on review of your office visits with cardiologist.  You have recently had some atypical chest discomfort.  The manner that your pain came on is atypical.  Initially had mild right lower rib area pain that migrated to the mid sternal area followed by a pop and now pain on the left side of your chest.  Pain does seem consistent with musculoskeletal type pain but some concern due to your history of stents placed in 2000 as well as your history of MI.  Your EKG looks similar to previous EKGs.  First-degree AV block seen but possible subtle ST elevation in lead II and V5.  Your sinus pressure has improved and your skin abrasion areas appear improved with doxycycline.  You have some sore mouth lesions presently and I am going to give you  Magic mouthwash to use 5 mL p.o. 4 times daily swish and spit.  I do think with your cardiac history and your atypical symptoms that it would be best to be evaluated in the emergency department.  I think imaging studies of your chest would be helpful as well as repeat EKG and at least one set of cardiac enzymes.  Follow-up date to be determined after ED evaluation.   40 minutes spent with patient.  50% of time spent counseling on current signs/symptoms.  Explained why I think it be beneficial to get work-up done on in the emergency department before holidays.  Mackie Pai, PA-C

## 2018-12-13 NOTE — Telephone Encounter (Signed)
I have not seen him in almost 2 years --- I will forward to edward since he saw the pt with this problem

## 2019-01-19 ENCOUNTER — Ambulatory Visit (INDEPENDENT_AMBULATORY_CARE_PROVIDER_SITE_OTHER): Payer: PPO | Admitting: Internal Medicine

## 2019-01-19 ENCOUNTER — Encounter: Payer: Self-pay | Admitting: Internal Medicine

## 2019-01-19 VITALS — BP 140/90 | HR 63 | Temp 97.6°F | Resp 14 | Ht 73.0 in | Wt 192.0 lb

## 2019-01-19 DIAGNOSIS — J069 Acute upper respiratory infection, unspecified: Secondary | ICD-10-CM

## 2019-01-19 DIAGNOSIS — I10 Essential (primary) hypertension: Secondary | ICD-10-CM | POA: Diagnosis not present

## 2019-01-19 MED ORDER — BENZONATATE 200 MG PO CAPS: 200.0000 mg | ORAL_CAPSULE | Freq: Three times a day (TID) | ORAL | 0 refills | Status: DC | PRN

## 2019-01-19 MED ORDER — AMOXICILLIN 500 MG PO CAPS: 1000.0000 mg | ORAL_CAPSULE | Freq: Two times a day (BID) | ORAL | 0 refills | Status: DC

## 2019-01-19 NOTE — Progress Notes (Signed)
Subjective:    Patient ID: Vincent Gong., male    DOB: 06/05/1941, 78 y.o.   MRN: [id number]  DOS:  01/19/2019 Type of visit - description: Acute Symptoms started 6 days ago with hoarseness, runny nose, cough, + sputum production, yellowish in color. He has long history of left-sided sinusitis, had previous surgeries there.  Think he has a sinus infection and needs antibiotics.  BP Readings from Last 3 Encounters:  01/19/19 140/90  12/13/18 (!) 196/110  12/13/18 (!) 170/90    Review of Systems No fever, had some chills last night No shortness of breath Was seen at the ER with chest pain last month, no chest pain at this time No nausea, vomiting, diarrhea. No unusual aches and pains No wheezing  Past Medical History:  Diagnosis Date  . 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 Arthritis   . Chicken pox   . Coronary artery disease    status post PTCA and stenting of his right coronary artery in 2000  . Frequent headaches   . Heart disease   . History of myocardial infarction 07/15/1999   cardiac catherization revealed a tight 99% stenosis in mid right coronary artery -- with successful PTCA and stenting of mid right  . History of prostate cancer   . Hyperlipidemia   . Hypertension   . Kidney stones     Past Surgical History:  Procedure Laterality Date  . APPENDECTOMY    . CORONARY ANGIOPLASTY WITH STENT PLACEMENT  2000   stenting of RCA  . RETINAL DETACHMENT REPAIR W/ SCLERAL BUCKLE LE  06/09/02   Retinal detachment, right eye  . ROBOT ASSISTED LAPAROSCOPIC RADICAL PROSTATECTOMY  07/06/2006   Clinical stage T1C adenocarcinoma of the prostate  . UMBILICAL HERNIA REPAIR  10/17/2004    Social History   Socioeconomic History  . Marital status: Married    Spouse name: Not on file  . Number of children: Not on file  . Years of education: Not on file  . Highest education level: Not on file  Occupational History  . Not on file    Social Needs  . Financial resource strain: Not on file  . Food insecurity:    Worry: Not on file    Inability: Not on file  . Transportation needs:    Medical: Not on file    Non-medical: Not on file  Tobacco Use  . Smoking status: Former Smoker    Last attempt to quit: 10/22/2005    Years since quitting: 13.2  . Smokeless tobacco: Never Used  Substance and Sexual Activity  . Alcohol use: No  . Drug use: No  . Sexual activity: Yes  Lifestyle  . Physical activity:    Days per week: Not on file    Minutes per session: Not on file  . Stress: Not on file  Relationships  . Social connections:    Talks on phone: Not on file    Gets together: Not on file    Attends religious service: Not on file    Active member of club or organization: Not on file    Attends meetings of clubs or organizations: Not on file    Relationship status: Not on file  . Intimate partner violence:    Fear of current or ex partner: Not on file    Emotionally abused: Not on file    Physically abused: Not on file    Forced sexual activity: Not on  file  Other Topics Concern  . Not on file  Social History Narrative  . Not on file      Allergies as of 01/19/2019      Reactions   Codeine Other (See Comments)   "can't relax or sleep"      Medication List       Accurate as of January 19, 2019  5:32 PM. Always use your most recent med list.        amoxicillin 500 MG capsule Commonly known as:  AMOXIL Take 2 capsules (1,000 mg total) by mouth 2 (two) times daily.   aspirin 81 MG EC tablet Take 81 mg by mouth daily.   atorvastatin 80 MG tablet Commonly known as:  LIPITOR TAKE 1 TABLET BY MOUTH ONCE DAILY 6 IN THE EVENING   benzonatate 200 MG capsule Commonly known as:  TESSALON Take 1 capsule (200 mg total) by mouth 3 (three) times daily as needed for cough.   CO Q 10 PO Take by mouth daily.   cyclobenzaprine 10 MG tablet Commonly known as:  FLEXERIL Take 1 tablet (10 mg total) by mouth  2 (two) times daily as needed for muscle spasms.   diclofenac sodium 1 % Gel Commonly known as:  VOLTAREN Apply 4 g topically 4 (four) times daily.   FISH OIL PO Take by mouth daily. (Total 5,000)   magic mouthwash Soln 5 ml po qid swish and spit.   metoprolol tartrate 50 MG tablet Commonly known as:  LOPRESSOR TAKE 1/2 (ONE-HALF) TABLET BY MOUTH TWICE DAILY   nitroGLYCERIN 0.4 MG SL tablet Commonly known as:  NITROSTAT One tablet under tongue up to three times if pain continues after 3 doses call 911           Objective:   Physical Exam BP 140/90 (BP Location: Left Arm, Patient Position: Sitting, Cuff Size: Normal)   Pulse 63   Temp 97.6 F (36.4 C)   Resp 14   Ht 6\' 1"  (1.854 m)   Wt 192 lb (87.1 kg)   SpO2 96%   BMI 25.33 kg/m  General:   Well developed, NAD, BMI noted. HEENT:  Normocephalic . Face symmetric, atraumatic. TMs normal, nose slightly congested, maxillary sinuses: Slightly TTP on the left side.  Throat symmetric not red. Lungs:  CTA B Normal respiratory effort, no intercostal retractions, no accessory muscle use. Heart: RRR,  no murmur.  No pretibial edema bilaterally  Skin: Not pale. Not jaundice Neurologic:  alert & oriented X3.  Speech normal, gait appropriate for age and unassisted Psych--  Cognition and judgment appear intact.  Cooperative with normal attention span and concentration.  Behavior appropriate. No anxious or depressed appearing.      Assessment    78 year old male, history of CAD, HTN, hyperlipidemia, presents with URI: Symptoms C/W URI, he has a long history of left-sided sinus infections, he is a slightly TTP there. Recommend supportive treatment with Mucinex, Tessalon Perles, Flonase. If not better in few days, start amoxicillin. HTN: BP today slightly elevated, that is not typically the case according to the patient when he checks.  No change for now.

## 2019-01-19 NOTE — Patient Instructions (Addendum)
Rest, fluids , tylenol  For cough:  Take Mucinex DM OTC  as needed until better Add tessalon perles if cough is severe   For nasal congestion: Use OTC Nasocort or Flonase : 2 nasal sprays on each side of the nose in the morning until you feel better   Avoid decongestants such as  Pseudoephedrine or phenylephrine     Take the antibiotic as prescribed  (Amoxicillin) if no better in 3-4 days   Call if not gradually better over the next  10 days  Call anytime if the symptoms are severe

## 2019-05-09 ENCOUNTER — Other Ambulatory Visit: Payer: Self-pay | Admitting: Cardiovascular Disease

## 2019-05-11 ENCOUNTER — Other Ambulatory Visit: Payer: Self-pay | Admitting: Cardiovascular Disease

## 2019-05-11 MED ORDER — ATORVASTATIN CALCIUM 80 MG PO TABS: ORAL_TABLET | ORAL | 0 refills | Status: DC

## 2019-05-16 IMAGING — CT CT HEAD W/O CM
3 of 7 series · 15 of 47 positions shown, 18 images · non-contrast
Comparison: None.

CLINICAL DATA: Fall trying to get out of the way from [employer]
on a minivan striking face on concrete.

EXAM:
CT HEAD WITHOUT CONTRAST
CT MAXILLOFACIAL WITHOUT CONTRAST
TECHNIQUE: Multidetector CT imaging of the head and maxillofacial structures
were performed using the standard protocol without intravenous
contrast. Multiplanar CT image reconstructions of the maxillofacial
structures were also generated.

[Series 4: cor head wo · coronal · 0.32mm/px · 3 of 77 slices shown]
[im 20/77  brain]
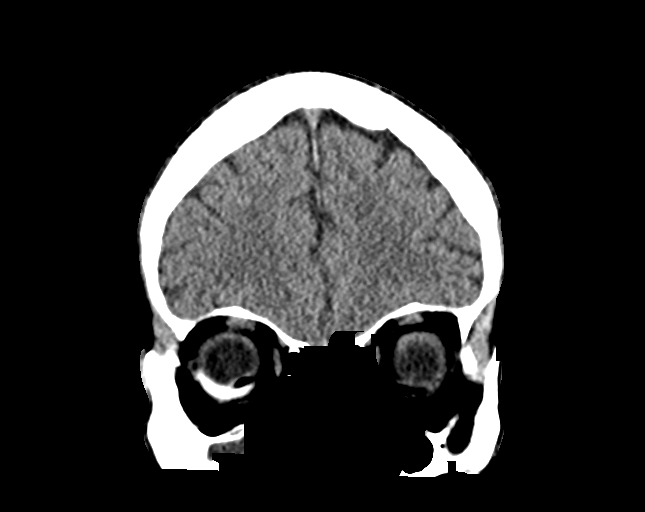
[im 39/77  brain]
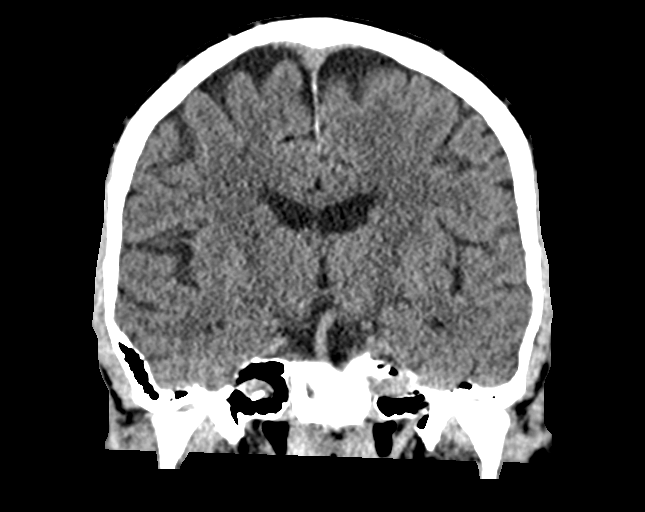
[im 58/77  brain]
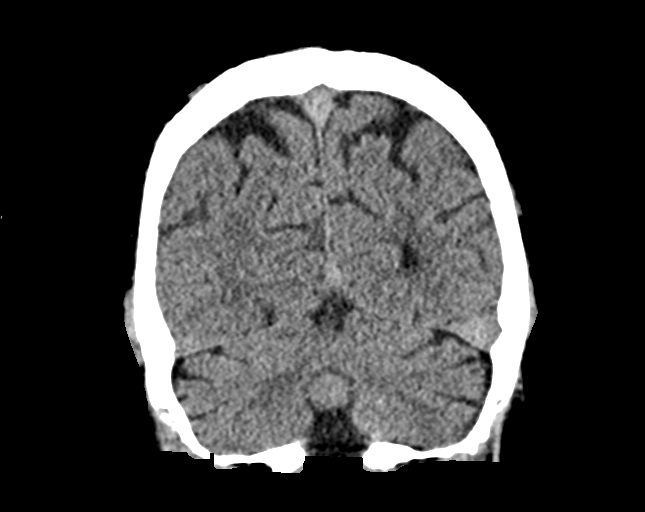

[Series 6: max soft · axial · 0.34mm/px · z∈[+960,+1104]mm · 11 of 80 slices shown, 14 images]
[im 4/80  brain]
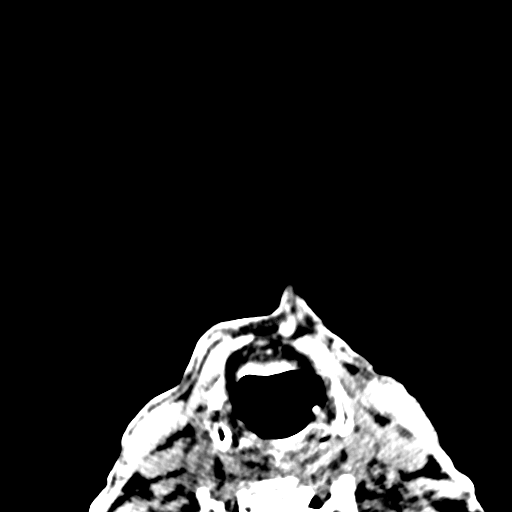
[im 4/80  bone]
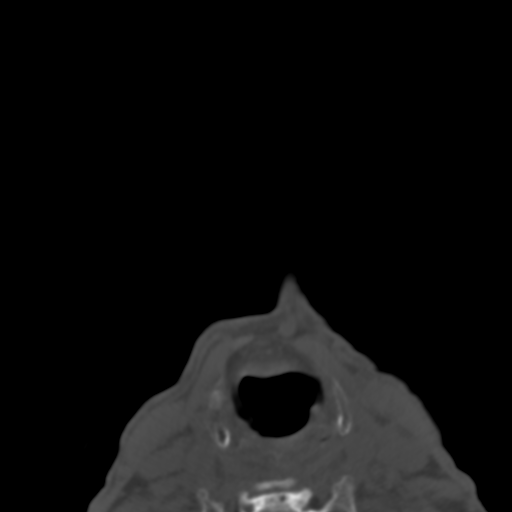
[im 12/80  brain]
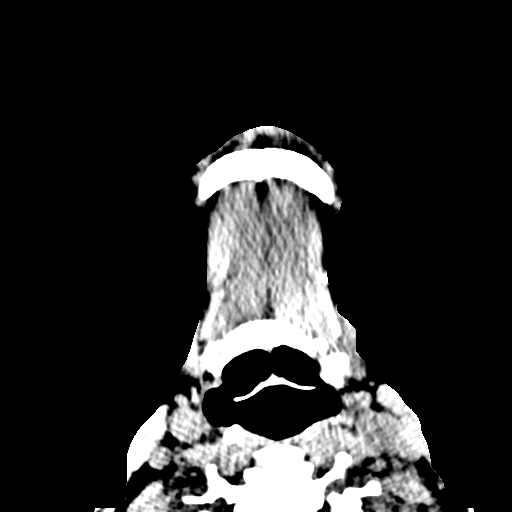
[im 19/80  brain]
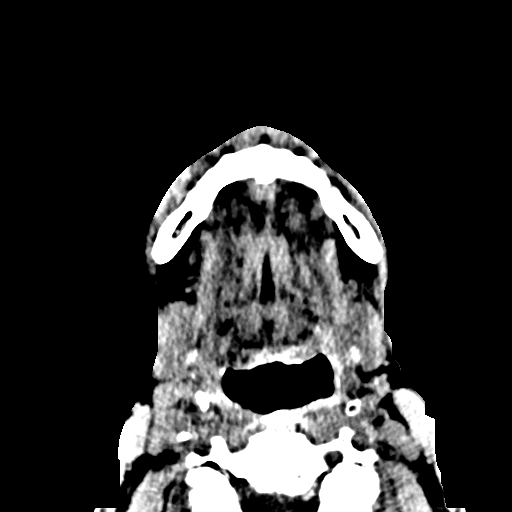
[im 27/80  brain]
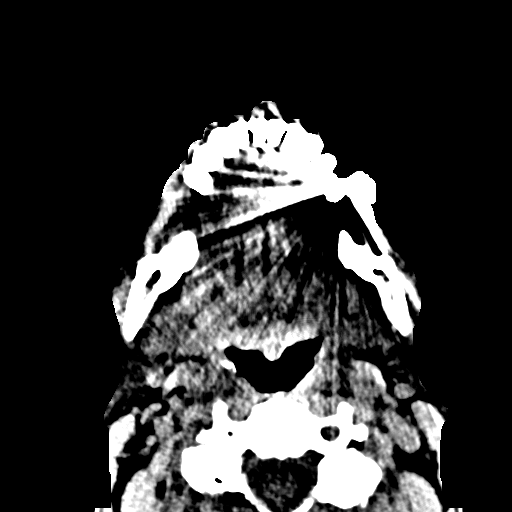
[im 34/80  brain]
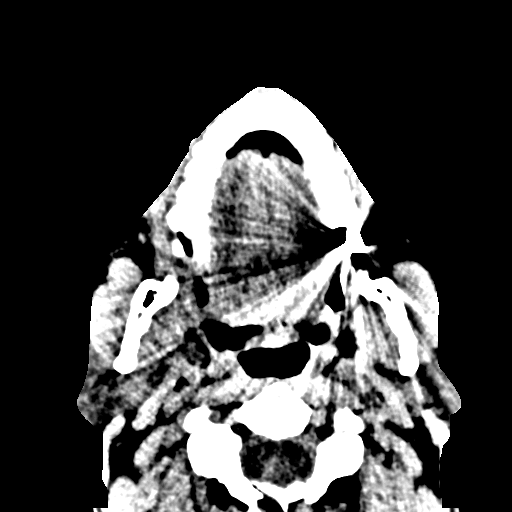
[im 34/80  bone]
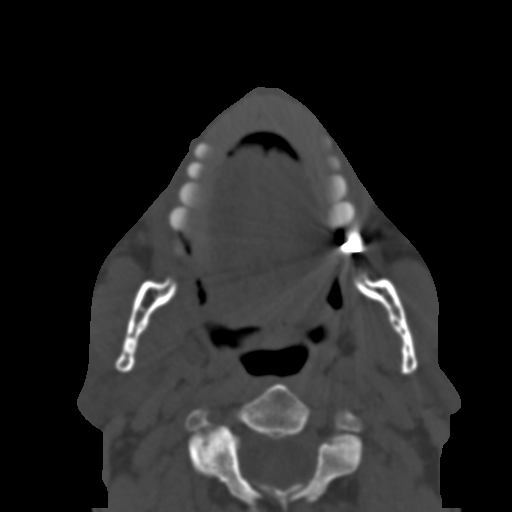
[im 42/80  brain]
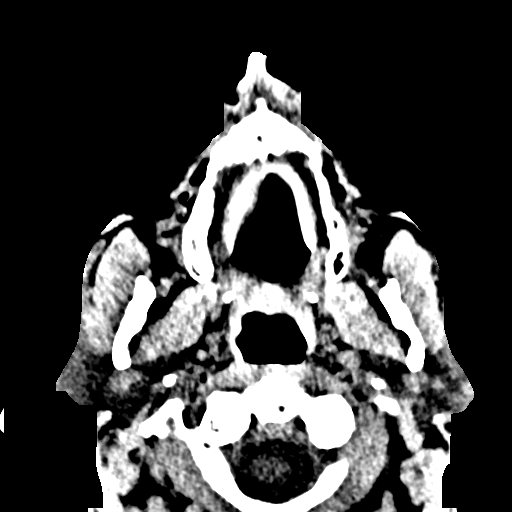
[im 46/80  brain]
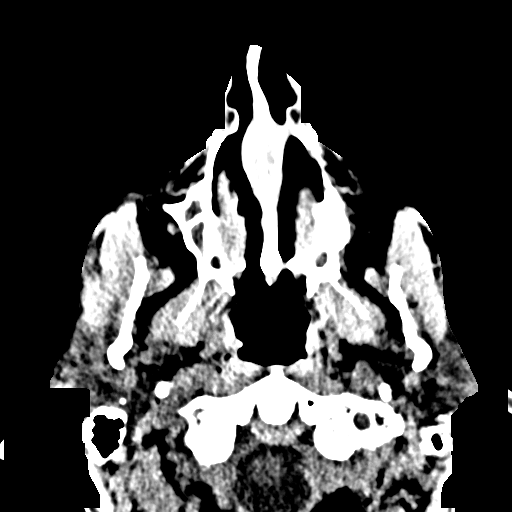
[im 53/80  brain]
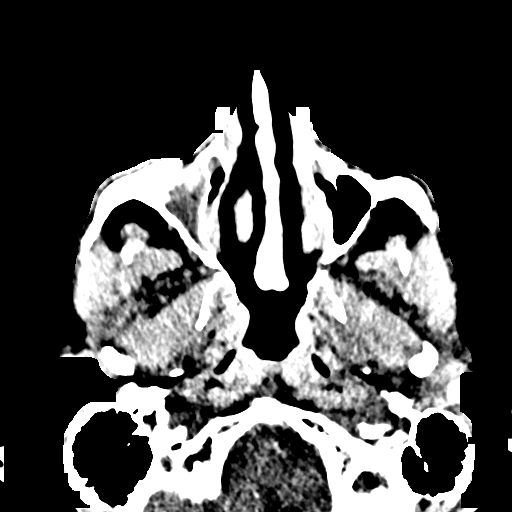
[im 61/80  brain]
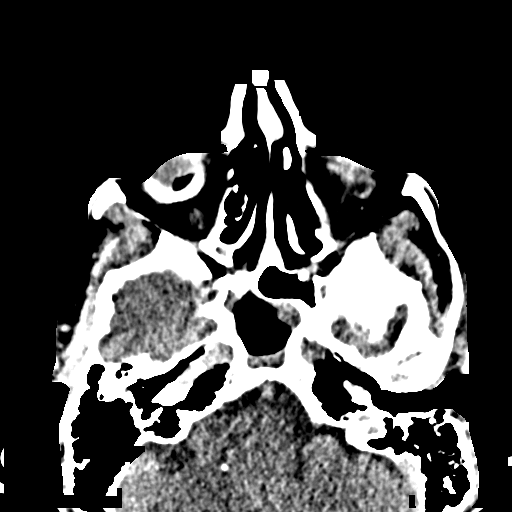
[im 61/80  bone]
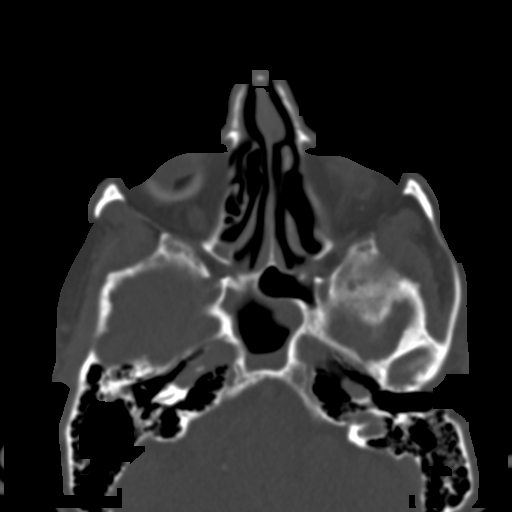
[im 68/80  brain]
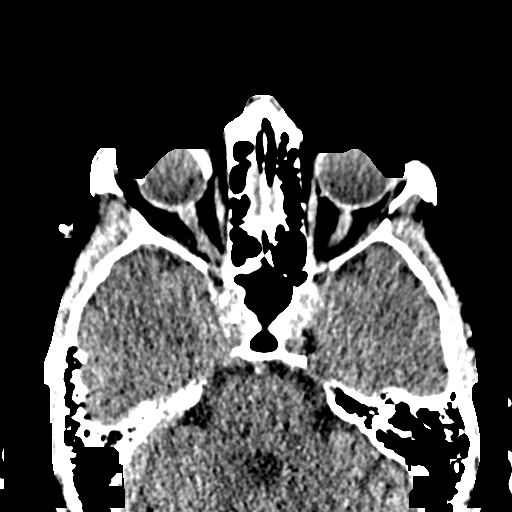
[im 76/80  brain]
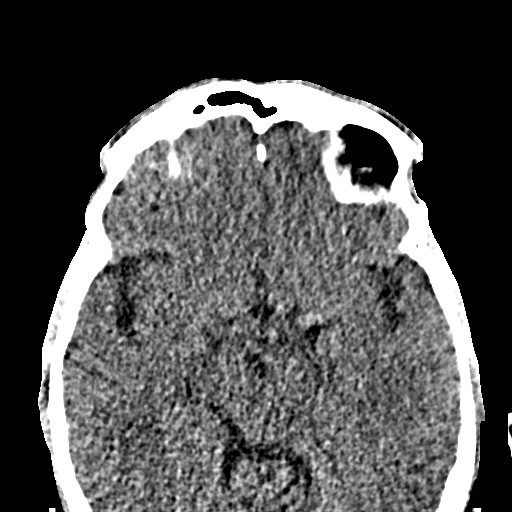

[Series 11: sagittal bone · sagittal · 0.33mm/px · 1 of 91 slices shown]
[im 46/91  brain]
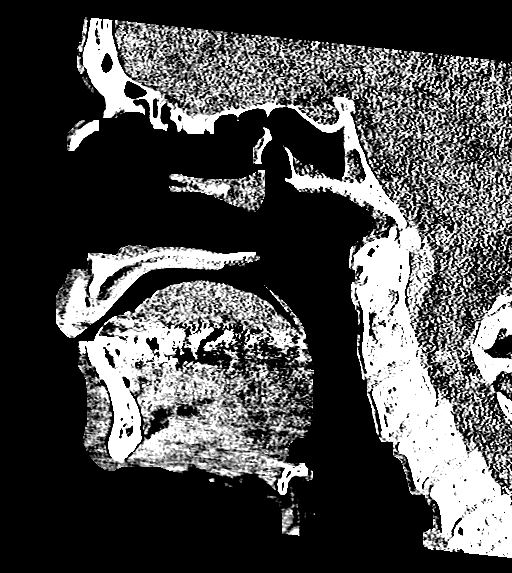

[15 of 47 positions shown; findings below may reference images not displayed]

FINDINGS: CT HEAD FINDINGS

Brain: Brain volume is normal for age. Mild periventricular white
matter changes typically seen with chronic small vessel ischemia. No
intracranial hemorrhage, mass effect, or midline shift. No
hydrocephalus. The basilar cisterns are patent. No evidence of
territorial infarct or acute ischemia. No extra-axial or
intracranial fluid collection.

Vascular: Atherosclerosis of skullbase vasculature without
hyperdense vessel or abnormal calcification.

Skull: No fracture or focal lesion.

Other: Left frontal scalp hematoma.

CT MAXILLOFACIAL FINDINGS

Osseous: No acute fracture of the zygomatic arches, nasal bones, or
mandibles. Temporomandibular joints are congruent with degenerative
change bilaterally, worse on the right. Patient is edentulous of
upper teeth.

Orbits: Prior ocular surgery with probable scleral banding on the
right and bilateral cataract resection. No evidence of acute globe
injury. No acute orbital fracture.

Sinuses: Postsurgical change of bilateral maxillary sinuses with
diffuse mucosal thickening on the right, lesser mucosal thickening
on the left. Circumferential mucosal thickening of the sphenoid
sinuses, with scattered opacification of ethmoid air cells. No
evidence of sinus fracture. The mastoid air cells are clear.

Soft tissues: Left supraorbital soft tissue contusion. No soft
tissue foreign body.
IMPRESSION: 1. Left frontal scalp hematoma. No acute intracranial abnormality.
No skull fracture.
2. Left supraorbital soft tissue edema without orbital or acute
facial bone fracture.

## 2019-07-08 ENCOUNTER — Other Ambulatory Visit: Payer: Self-pay | Admitting: Cardiovascular Disease

## 2019-07-09 ENCOUNTER — Other Ambulatory Visit: Payer: Self-pay | Admitting: Cardiovascular Disease

## 2019-07-22 ENCOUNTER — Other Ambulatory Visit: Payer: Self-pay

## 2019-09-01 NOTE — Progress Notes (Signed)
Vincent Brock. Date of Birth  15-Jun-1941 [facility]        [address]    [place]     [place], [place]  [address]      [phone]  Fax  [phone]     Problem list:  1. Coronary artery disease: Status post PTCA and stenting of the RCA in 2000. Who placed a 4.0 x 18 mm NIR Royal post dilated up to 4.4 mm 2. Hyperlipidemia 3. Hypertension 4. Prostate cancer   78 yo with cad.  No chest pain.  Works out every day.  Had knee surgery several months ago.    Sept. 12, 2014:  No angina.  Has had some BP issues - up and down.   Typical readings are 130 / 85.  Lots of anxiety at night.   He has been going to the [facility].    Sept. 17, 2015:  Doing well.  No dyspnea, no CP,  Fatigues eaily.   Working out at [employer].  Sept. 27, 2016:  Doing well.  No CP ,  BP is a bit higher today . Typically is i the normal range.   Jan. 25, 2018:   Rush Landmark is doing well.   Has not been exercising as much.  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  Wears him out mentally - is not getting much exercise.  BP has been good   Apr 21, 2018:  Rush Landmark is seen today for follow-up visit.  He has a history of coronary artery disease with stenting. 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 11, 2020   Rush Landmark is seen today.  No CP , no dyspnea.  Is not exercising  Has gained some weight.  May be eating more salt,  Is definitely more anxious .  Has not been going to the [facility]    Current Outpatient Medications on File Prior to Visit  Medication Sig Dispense Refill  . aspirin 81 MG EC tablet Take 81 mg by mouth daily.      Marland Kitchen atorvastatin (LIPITOR) 80 MG tablet TAKE 1 TABLET BY MOUTH ONCE DAILY AT 6 IN THE EVENING. Please make overdue appt with Dr. Acie Fredrickson before anymore refills. 1st attempt 30 tablet 0  . benzonatate (TESSALON) 200 MG capsule  Take 1 capsule (200 mg total) by mouth 3 (three) times daily as needed for cough. 20 capsule 0  . Coenzyme Q10 (CO Q 10 PO) Take by mouth daily.    . cyclobenzaprine (FLEXERIL) 10 MG tablet Take 1 tablet (10 mg total) by mouth 2 (two) times daily as needed for muscle spasms. 10 tablet 0  . diclofenac sodium (VOLTAREN) 1 % GEL Apply 4 g topically 4 (four) times daily. 100 g 1  . magic mouthwash SOLN 5 ml po qid swish and spit. 200 mL 0  . metoprolol tartrate (LOPRESSOR) 50 MG tablet Take 1/2 (one-half) tablet by mouth twice daily 90 tablet 0  . nitroGLYCERIN (NITROSTAT) 0.4 MG SL tablet One tablet under tongue up to three times if pain continues after 3 doses call 911 25 tablet 3  . Omega-3 Fatty Acids (FISH OIL PO) Take by mouth daily. (Total 5,000)     No current facility-administered medications on file prior  to visit.     Allergies  Allergen Reactions  . Codeine Other (See Comments)    "can't relax or sleep"    Past Medical History:  Diagnosis Date  . 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 Arthritis   . Chicken pox   . Coronary artery disease    status post PTCA and stenting of his right coronary artery in 2000  . Frequent headaches   . Heart disease   . History of myocardial infarction 07/15/1999   cardiac catherization revealed a tight 99% stenosis in mid right coronary artery -- with successful PTCA and stenting of mid right  . History of prostate cancer   . Hyperlipidemia   . Hypertension   . Kidney stones     Past Surgical History:  Procedure Laterality Date  . APPENDECTOMY    . CORONARY ANGIOPLASTY WITH STENT PLACEMENT  2000   stenting of RCA  . RETINAL DETACHMENT REPAIR W/ SCLERAL BUCKLE LE  06/09/02   Retinal detachment, right eye  . ROBOT ASSISTED LAPAROSCOPIC RADICAL PROSTATECTOMY  07/06/2006   Clinical stage T1C adenocarcinoma of the prostate  . UMBILICAL HERNIA REPAIR  10/17/2004    Social History   Tobacco Use   Smoking Status Former Smoker  . Quit date: 10/22/2005  . Years since quitting: 13.8  Smokeless Tobacco Never Used    Social History   Substance and Sexual Activity  Alcohol Use No    Family History  Problem Relation Age of Onset  . Heart attack Father 80  . Tuberculosis Father   . Heart failure Father   . Cirrhosis Mother   . Coronary artery disease Sister        with stents    Reviw of Systems:  Reviewed in the HPI.  All other systems are negative.  Physical Exam: Blood pressure (!) 158/96, pulse 61, height 6\' 1"  (1.854 m), weight 195 lb 6.4 oz (88.6 kg), SpO2 97 %.  GEN:  Well nourished, well developed in no acute distress HEENT: Normal NECK: No JVD; No carotid bruits LYMPHATICS: No lymphadenopathy CARDIAC: RRR , no murmurs, rubs, gallops RESPIRATORY:  Clear to auscultation without rales, wheezing or rhonchi  ABDOMEN: Soft, non-tender, non-distended MUSCULOSKELETAL:  No edema; No deformity  SKIN: Warm and dry NEUROLOGIC:  Alert and oriented x 3    ECG: Sept. 11, 2020  NSR at 65.  1st degree AV block .   Inc. RBBB.  Previous inf. MI   Assessment / Plan:   1. Coronary artery disease: Status post PTCA and stenting of the RCA in 2000. Who placed a 4.0 x 18 mm NIR Royal post dilated up to 4.4 mm Doing well .  No angina   2. Hyperlipidemia -     Cont atorva.  Check labs in 3 weeks   3. Hypertension: His blood pressure is up.  He admits to eating more salt than he should.  He also has not been exercising and has gained some weight.  I encouraged him to work on his weight loss.  We will add HCTZ 25 mg a day and potassium chloride 20 mEq a day.  We will have him return in 3 months to see an APP or May for follow-up visit.  Anticipate starting an ARB if his blood pressure is still elevated.  Have encouraged him to go ahead and start an exercise program and work on weight loss now..  4. Prostate cancer Plans per Dr. Diona Fanti.  Mertie Moores, MD  09/02/2019 8:54 AM     [address],  [place], [place]  [address] Pager [phone] Phone: [phone]; Fax: [phone]

## 2019-09-02 ENCOUNTER — Other Ambulatory Visit: Payer: Self-pay

## 2019-09-02 ENCOUNTER — Encounter: Payer: Self-pay | Admitting: Cardiovascular Disease

## 2019-09-02 ENCOUNTER — Ambulatory Visit: Payer: PPO | Admitting: Cardiovascular Disease

## 2019-09-02 VITALS — BP 158/96 | HR 61 | Ht 73.0 in | Wt 195.4 lb

## 2019-09-02 DIAGNOSIS — E782 Mixed hyperlipidemia: Secondary | ICD-10-CM

## 2019-09-02 DIAGNOSIS — I2510 Atherosclerotic heart disease of native coronary artery without angina pectoris: Secondary | ICD-10-CM

## 2019-09-02 DIAGNOSIS — I10 Essential (primary) hypertension: Secondary | ICD-10-CM | POA: Diagnosis not present

## 2019-09-02 MED ORDER — POTASSIUM CHLORIDE CRYS ER 20 MEQ PO TBCR: 20.0000 meq | EXTENDED_RELEASE_TABLET | Freq: Every day | ORAL | 3 refills | Status: DC

## 2019-09-02 MED ORDER — HYDROCHLOROTHIAZIDE 25 MG PO TABS: 25.0000 mg | ORAL_TABLET | Freq: Every day | ORAL | 3 refills | Status: DC

## 2019-09-02 NOTE — Patient Instructions (Addendum)
Medication Instructions:  Your physician has recommended you make the following change in your medication: START Hydrochlorathiazide 25 mg daily              K-Dur  20 mg daily  If you need a refill on your cardiac medications before your next appointment, please call your pharmacy.   Lab work: Your physician recommends that you return for Fasting  lab work in: 3 Weeks Sep 23, 2019 at 0845  Liver, Cholesterol, BMP  If you have labs (blood work) drawn today and your tests are completely normal, you will receive your results only by: Marland Kitchen MyChart Message (if you have MyChart) OR . A paper copy in the mail If you have any lab test that is abnormal or we need to change your treatment, we will call you to review the results.  Testing/Procedures: None Ordered  Follow-Up: At [facility], you and your health needs are our priority.  As part of our continuing mission to provide you with exceptional heart care, we have created designated Provider Care Teams.  These Care Teams include your primary Cardiologist (physician) and Advanced Practice Providers (APPs -  Physician Assistants and Nurse Practitioners) who all work together to provide you with the care you need, when you need it. You will need a follow up appointment in:  3 months.  Please call our office 2 months in advance to schedule this appointment.  You may seeor one of the following Advanced Practice Providers on your designated Care Team: Richardson Dopp, PA-C Vin Holland, [place] . Daune Perch, NP  Any Other Special Instructions Will Be Listed Below (If Applicable).

## 2019-09-08 ENCOUNTER — Other Ambulatory Visit: Payer: Self-pay | Admitting: *Deleted

## 2019-09-08 DIAGNOSIS — R6889 Other general symptoms and signs: Secondary | ICD-10-CM | POA: Diagnosis not present

## 2019-09-08 DIAGNOSIS — Z20822 Contact with and (suspected) exposure to covid-19: Secondary | ICD-10-CM

## 2019-09-10 LAB — NOVEL CORONAVIRUS, NAA

## 2019-09-15 ENCOUNTER — Telehealth: Payer: Self-pay | Admitting: Family Medicine

## 2019-09-15 NOTE — Telephone Encounter (Signed)
Patient called and notified that he would need to be retested due to COVID-19 test from 09/08/19 being inconclusive. Pt states that he did speak with someone earlier today regarding inconclusive test and was notified that he would need to be retested. Pt states he feels fine at this time and he does not currently have symptoms. Pt states he does not think he needs to be retested at this time but if symptoms develop he will go for retesting.

## 2019-09-23 ENCOUNTER — Other Ambulatory Visit: Payer: PPO

## 2019-09-29 ENCOUNTER — Other Ambulatory Visit: Payer: PPO | Admitting: *Deleted

## 2019-09-29 ENCOUNTER — Other Ambulatory Visit: Payer: Self-pay

## 2019-09-29 DIAGNOSIS — I2510 Atherosclerotic heart disease of native coronary artery without angina pectoris: Secondary | ICD-10-CM | POA: Diagnosis not present

## 2019-09-29 DIAGNOSIS — E782 Mixed hyperlipidemia: Secondary | ICD-10-CM

## 2019-09-29 DIAGNOSIS — I10 Essential (primary) hypertension: Secondary | ICD-10-CM

## 2019-09-29 LAB — LIPID PANEL
Chol/HDL Ratio: 2.5 ratio (ref 0.0–5.0)
Cholesterol, Total: 121 mg/dL (ref 100–199)
HDL: 48 mg/dL (ref >39)
LDL Chol Calc (NIH): 58 mg/dL (ref 0–99)
Triglycerides: 74 mg/dL (ref 0–149)
VLDL Cholesterol Cal: 15 mg/dL (ref 5–40)

## 2019-09-29 LAB — BASIC METABOLIC PANEL
BUN/Creatinine Ratio: 14 (ref 10–24)
BUN: 16 mg/dL (ref 8–27)
CO2: 26 mmol/L (ref 20–29)
Calcium: 9.5 mg/dL (ref 8.6–10.2)
Chloride: 102 mmol/L (ref 96–106)
Creatinine, Ser: 1.15 mg/dL (ref 0.76–1.27)
GFR calc Af Amer: 71 mL/min/{1.73_m2} (ref >59)
GFR calc non Af Amer: 61 mL/min/{1.73_m2} (ref >59)
Glucose: 107 mg/dL — ABNORMAL HIGH (ref 65–99)
Potassium: 4.1 mmol/L (ref 3.5–5.2)
Sodium: 140 mmol/L (ref 134–144)

## 2019-09-29 LAB — HEPATIC FUNCTION PANEL
ALT: 21 IU/L (ref 0–44)
AST: 23 IU/L (ref 0–40)
Albumin: 4.4 g/dL (ref 3.7–4.7)
Alkaline Phosphatase: 97 IU/L (ref 39–117)
Bilirubin Total: 0.5 mg/dL (ref 0.0–1.2)
Bilirubin, Direct: 0.17 mg/dL (ref 0.00–0.40)
Total Protein: 6.5 g/dL (ref 6.0–8.5)

## 2019-10-18 ENCOUNTER — Other Ambulatory Visit: Payer: Self-pay | Admitting: Cardiovascular Disease

## 2019-10-19 ENCOUNTER — Other Ambulatory Visit: Payer: Self-pay | Admitting: Cardiovascular Disease

## 2019-10-19 MED ORDER — METOPROLOL TARTRATE 50 MG PO TABS: ORAL_TABLET | ORAL | 3 refills | Status: DC

## 2019-11-25 MED FILL — DICLOFENAC SODIUM 1 % GEL: 1 | 7 days supply | Qty: 100 | Fill #1

## 2019-11-30 ENCOUNTER — Ambulatory Visit: Payer: Self-pay | Admitting: *Deleted

## 2019-11-30 ENCOUNTER — Ambulatory Visit (INDEPENDENT_AMBULATORY_CARE_PROVIDER_SITE_OTHER): Payer: PPO | Admitting: Medical

## 2019-11-30 ENCOUNTER — Encounter: Payer: Self-pay | Admitting: Medical

## 2019-11-30 ENCOUNTER — Other Ambulatory Visit: Payer: Self-pay | Admitting: Medical

## 2019-11-30 ENCOUNTER — Telehealth: Payer: Self-pay | Admitting: Medical

## 2019-11-30 ENCOUNTER — Ambulatory Visit (HOSPITAL_BASED_OUTPATIENT_CLINIC_OR_DEPARTMENT_OTHER)
Admission: RE | Admit: 2019-11-30 | Discharge: 2019-11-30 | Disposition: A | Discharge status: Home / Self Care | Payer: PPO | Source: Ambulatory Visit | Attending: Medical | Admitting: Medical

## 2019-11-30 ENCOUNTER — Other Ambulatory Visit: Payer: Self-pay

## 2019-11-30 VITALS — BP 126/88 | HR 73 | Temp 97.0°F | Resp 16 | Ht 73.0 in | Wt 198.6 lb

## 2019-11-30 DIAGNOSIS — S60312A Abrasion of left thumb, initial encounter: Secondary | ICD-10-CM

## 2019-11-30 DIAGNOSIS — T148XXA Other injury of unspecified body region, initial encounter: Secondary | ICD-10-CM

## 2019-11-30 DIAGNOSIS — S60311A Abrasion of right thumb, initial encounter: Secondary | ICD-10-CM

## 2019-11-30 DIAGNOSIS — S60411A Abrasion of left index finger, initial encounter: Secondary | ICD-10-CM | POA: Diagnosis not present

## 2019-11-30 DIAGNOSIS — S80211A Abrasion, right knee, initial encounter: Secondary | ICD-10-CM | POA: Diagnosis not present

## 2019-11-30 DIAGNOSIS — M79642 Pain in left hand: Secondary | ICD-10-CM

## 2019-11-30 DIAGNOSIS — Z23 Encounter for immunization: Secondary | ICD-10-CM | POA: Diagnosis not present

## 2019-11-30 DIAGNOSIS — M79645 Pain in left finger(s): Secondary | ICD-10-CM

## 2019-11-30 DIAGNOSIS — S60222A Contusion of left hand, initial encounter: Secondary | ICD-10-CM | POA: Diagnosis not present

## 2019-11-30 NOTE — Progress Notes (Signed)
Subjective:    Patient ID: Vincent Gong., male    DOB: Feb 24, 1941, 78 y.o.   MRN: [id number]  HPI  Pt in for evaluation. Tripped on curb in [employer] parking lot. He held out his hands to try and break the fall. He has abrasion to rt thumb. Abrasion to left distal index finger and left thumb pad. After fall he had pain in left 5th digit and left 5th metacarpal distal aspect.  He got scrape to rt knee as well.   Review of Systems  Constitutional: Negative for chills, fatigue and fever.  Respiratory: Negative for cough, chest tightness, shortness of breath and wheezing.   Cardiovascular: Negative for chest pain and palpitations.  Gastrointestinal: Negative for abdominal pain.  Musculoskeletal: Negative for gait problem and neck pain.       See hpi.  Skin: Negative for rash.       abrasions  Neurological: Negative for dizziness, speech difficulty, weakness, light-headedness and headaches.  Hematological: Negative for adenopathy. Does not bruise/bleed easily.  Psychiatric/Behavioral: Negative for behavioral problems and sleep disturbance. The patient is not nervous/anxious.     Past Medical History:  Diagnosis Date  . 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 Arthritis   . Chicken pox   . Coronary artery disease    status post PTCA and stenting of his right coronary artery in 2000  . Frequent headaches   . Heart disease   . History of myocardial infarction 07/15/1999   cardiac catherization revealed a tight 99% stenosis in mid right coronary artery -- with successful PTCA and stenting of mid right  . History of prostate cancer   . Hyperlipidemia   . Hypertension   . Kidney stones      Social History   Socioeconomic History  . Marital status: Married    Spouse name: Not on file  . Number of children: Not on file  . Years of education: Not on file  . Highest education level: Not on file  Occupational History  . Not on file  Social  Needs  . Financial resource strain: Not on file  . Food insecurity    Worry: Not on file    Inability: Not on file  . Transportation needs    Medical: Not on file    Non-medical: Not on file  Tobacco Use  . Smoking status: Former Smoker    Quit date: 10/22/2005    Years since quitting: 14.1  . Smokeless tobacco: Never Used  Substance and Sexual Activity  . Alcohol use: No  . Drug use: No  . Sexual activity: Yes  Lifestyle  . Physical activity    Days per week: Not on file    Minutes per session: Not on file  . Stress: Not on file  Relationships  . Social Herbalist on phone: Not on file    Gets together: Not on file    Attends religious service: Not on file    Active member of club or organization: Not on file    Attends meetings of clubs or organizations: Not on file    Relationship status: Not on file  . Intimate partner violence    Fear of current or ex partner: Not on file    Emotionally abused: Not on file    Physically abused: Not on file    Forced sexual activity: Not on file  Other Topics Concern  . Not on file  Social History Narrative  . Not on file    Past Surgical History:  Procedure Laterality Date  . APPENDECTOMY    . CORONARY ANGIOPLASTY WITH STENT PLACEMENT  2000   stenting of RCA  . RETINAL DETACHMENT REPAIR W/ SCLERAL BUCKLE LE  06/09/02   Retinal detachment, right eye  . ROBOT ASSISTED LAPAROSCOPIC RADICAL PROSTATECTOMY  07/06/2006   Clinical stage T1C adenocarcinoma of the prostate  . UMBILICAL HERNIA REPAIR  10/17/2004    Family History  Problem Relation Age of Onset  . Heart attack Father 83  . Tuberculosis Father   . Heart failure Father   . Cirrhosis Mother   . Coronary artery disease Sister        with stents    Allergies  Allergen Reactions  . Codeine Other (See Comments)    "can't relax or sleep"    Current Outpatient Medications on File Prior to Visit  Medication Sig Dispense Refill  . aspirin 81 MG EC tablet  Take 81 mg by mouth daily.      Marland Kitchen atorvastatin (LIPITOR) 80 MG tablet TAKE 1 TABLET BY MOUTH ONCE DAILY AT 6 IN THE EVENING. Please make overdue appt with Dr. Acie Fredrickson before anymore refills. 1st attempt 30 tablet 0  . Coenzyme Q10 (CO Q 10 PO) Take by mouth daily.    . cyclobenzaprine (FLEXERIL) 10 MG tablet Take 1 tablet (10 mg total) by mouth 2 (two) times daily as needed for muscle spasms. 10 tablet 0  . diclofenac sodium (VOLTAREN) 1 % GEL Apply 4 g topically 4 (four) times daily. 100 g 1  . hydrochlorothiazide (HYDRODIURIL) 25 MG tablet Take 1 tablet (25 mg total) by mouth daily. 90 tablet 3  . magic mouthwash SOLN 5 ml po qid swish and spit. 200 mL 0  . metoprolol tartrate (LOPRESSOR) 50 MG tablet Take 1/2 (one-half) tablet by mouth twice daily 90 tablet 3  . nitroGLYCERIN (NITROSTAT) 0.4 MG SL tablet One tablet under tongue up to three times if pain continues after 3 doses call 911 25 tablet 3  . Omega-3 Fatty Acids (FISH OIL PO) Take by mouth daily. (Total 5,000)    . potassium chloride SA (K-DUR) 20 MEQ tablet Take 1 tablet (20 mEq total) by mouth daily. 90 tablet 3   No current facility-administered medications on file prior to visit.     BP 126/88 (BP Location: Left Arm, Patient Position: Sitting, Cuff Size: Small)   Pulse 73   Temp (!) 97 F (36.1 C) (Temporal)   Resp 16   Ht 6\' 1"  (1.854 m)   Wt 198 lb 9.6 oz (90.1 kg)   SpO2 98%   BMI 26.20 kg/m       Objective:   Physical Exam  General- No acute distress. Pleasant patient. Neck- Full range of motion, no jvd Lungs- Clear, even and unlabored. Heart- regular rate and rhythm. Neurologic- CNII- XII grossly intact.  Left hand- distal 5th metatarsal pain on palpation. No bursing. Left wrist no pain on palpation. Hands- small abrasion rt thumb, left thumb and left index finger. Rt knee- moderate large abrasion to knee. Good range of motion. No pain on rom. No instability.      Assessment & Plan:  For left hand pain  will get xray to evaluate if fracture.  For abrasions will go ahead and update tdap today.  Recommend use topical neosporin and apply twice a day.  Follow up date to be determined after xray review.  [phone]. Pt  number. Wife cell [place], PA-C

## 2019-11-30 NOTE — Addendum Note (Signed)
Addended by: Kem Boroughs D on: 11/30/2019 03:14 PM   Modules accepted: Orders

## 2019-11-30 NOTE — Patient Instructions (Signed)
For left hand pain will get xray to evaluate if fracture.  For abrasions will go ahead and update tdap today.  Recommend use topical neosporin and apply twice a day.  Follow up date to be determined after xray review.

## 2019-11-30 NOTE — Telephone Encounter (Signed)
Pt stated that he fell on the concrete yesterday and hit his left hand.  He states his hand is swollen and bruising now. He can use his hand but it hurts. He can make a fist and it hurts. He denies being on blood thinners.  Per protocol, he needs to be seen within  Hours.  LB at [facility] notified regarding an appointment. Call conference in with the office. Routing to the office for review.    Reason for Disposition . Large swelling or bruise (> 2 inches or 5 cm)  Answer Assessment - Initial Assessment Questions 1. MECHANISM: "How did the injury happen?"     Fell yesterday  2. ONSET: "When did the injury happen?" (Minutes or hours ago)      yesterday 3. APPEARANCE of INJURY: "What does the injury look like?"      Swollen hand 4. SEVERITY: "Can you use the hand normally?" "Can you bend your fingers into a ball and then fully open them?"     Can make a ball with his hand 5. SIZE: For cuts, bruises, or swelling, ask: "How large is it?" (e.g., inches or centimeters;  entire hand or wrist)      swelling 6. PAIN: "Is there pain?" If so, ask: "How bad is the pain?"  (Scale 1-10; or mild, moderate, severe)     5 if he moves it 7. TETANUS: For any breaks in the skin, ask: "When was the last tetanus booster?"    n/a 8. OTHER SYMPTOMS: "Do you have any other symptoms?"      none 9. PREGNANCY: "Is there any chance you are pregnant?" "When was your last menstrual period?"    n/a  Protocols used: HAND AND WRIST INJURY-A-AH

## 2019-11-30 NOTE — Telephone Encounter (Signed)
Referral to sports medicine placed

## 2019-12-01 ENCOUNTER — Ambulatory Visit: Payer: Self-pay

## 2019-12-01 ENCOUNTER — Ambulatory Visit: Payer: PPO | Admitting: Family Medicine

## 2019-12-01 ENCOUNTER — Encounter: Payer: Self-pay | Admitting: Family Medicine

## 2019-12-01 ENCOUNTER — Other Ambulatory Visit: Payer: Self-pay

## 2019-12-01 VITALS — BP 132/85 | HR 71 | Ht 73.0 in | Wt 190.0 lb

## 2019-12-01 DIAGNOSIS — M79642 Pain in left hand: Secondary | ICD-10-CM | POA: Diagnosis not present

## 2019-12-01 NOTE — Patient Instructions (Signed)
Nice to meet you Please try to elevate your hand  Please try heat if it helps   Please send me a message in [place] with any questions or updates.  Please see me back in 4 weeks or as needed if you are better.   --Dr. Raeford Razor

## 2019-12-01 NOTE — Progress Notes (Signed)
Vincent Brock. - 78 y.o. male MRN [id number]  Date of birth: 1941/05/12  SUBJECTIVE:  Including CC & ROS.  Chief Complaint  Patient presents with   Finger Injury    left ring and pinky x 11/29/2019    Vincent Brock. is a 78 y.o. male that is presenting with left hand pain after a fall.  It occurred while he was in the [employer] parking lot.  He has had some pain in the fourth and fifth metacarpal and significant ecchymosis over the ulnar aspect of the hand.  He denies any problems with range of motion.  Pain is mild and intermittent.  He feels like his symptoms have improved since his fall.  He is currently not exercising.  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  Denies any numbness or tingling.  Independent review of the left hand x-ray from 12/9 shows a possible middle phalanx fracture.   Review of Systems  Constitutional: Negative for fever.  HENT: Negative for congestion.   Respiratory: Negative for cough.   Cardiovascular: Negative for chest pain.  Gastrointestinal: Negative for abdominal distention.  Musculoskeletal: Positive for arthralgias.  Skin: Positive for color change.  Neurological: Negative for weakness.  Hematological: Negative for adenopathy.    HISTORY: Past Medical, Surgical, Social, and Family History Reviewed & Updated per EMR.   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   Arthritis    Chicken pox    Coronary artery disease    status post PTCA and stenting of his right coronary artery in 2000   Frequent headaches    Heart disease    History of myocardial infarction 07/15/1999   cardiac catherization revealed a tight 99% stenosis in mid right coronary artery -- with successful PTCA and stenting of mid right   History of prostate cancer    Hyperlipidemia    Hypertension    Kidney stones     Past Surgical History:  Procedure  Laterality Date   APPENDECTOMY     CORONARY ANGIOPLASTY WITH STENT PLACEMENT  2000   stenting of RCA   RETINAL DETACHMENT REPAIR W/ SCLERAL BUCKLE LE  06/09/02   Retinal detachment, right eye   ROBOT ASSISTED LAPAROSCOPIC RADICAL PROSTATECTOMY  07/06/2006   Clinical stage T1C adenocarcinoma of the prostate   UMBILICAL HERNIA REPAIR  10/17/2004    Allergies  Allergen Reactions   Codeine Other (See Comments)    "can't relax or sleep"    Family History  Problem Relation Age of Onset   Heart attack Father 27   Tuberculosis Father    Heart failure Father    Cirrhosis Mother    Coronary artery disease Sister        with stents     Social History   Socioeconomic History   Marital status: Married    Spouse name: Not on file   Number of children: Not on file   Years of education: Not on file   Highest education level: Not on file  Occupational History   Not on file  Tobacco Use   Smoking status: Former Smoker    Quit date: 10/22/2005    Years since quitting: 14.1   Smokeless tobacco: Never Used  Substance and Sexual Activity   Alcohol use: No   Drug use: No   Sexual activity: Yes  Other Topics Concern   Not on file  Social History Narrative  Not on file   Social Determinants of Health   Financial Resource Strain:    Difficulty of Paying Living Expenses: Not on file  Food Insecurity:    Worried About [place] in the Last Year: Not on file   Ran Out of Food in the Last Year: Not on file  Transportation Needs:    Lack of Transportation (Medical): Not on file   Lack of Transportation (Non-Medical): Not on file  Physical Activity:    Days of Exercise per Week: Not on file   Minutes of Exercise per Session: Not on file  Stress:    Feeling of Stress : Not on file  Social Connections:    Frequency of Communication with Friends and Family: Not on file   Frequency of Social Gatherings with Friends and Family: Not on file    Attends Religious Services: Not on file   Active Member of [employer] or Organizations: Not on file   Attends Archivist Meetings: Not on file   Marital Status: Not on file  Intimate Partner Violence:    Fear of Current or Ex-Partner: Not on file   Emotionally Abused: Not on file   Physically Abused: Not on file   Sexually Abused: Not on file     PHYSICAL EXAM:  VS: BP 132/85    Pulse 71    Ht 6\' 1"  (1.854 m)    Wt 190 lb (86.2 kg)    BMI 25.07 kg/m  Physical Exam Gen: NAD, alert, cooperative with exam, well-appearing ENT: normal lips, normal nasal mucosa,  Eye: normal EOM, normal conjunctiva and lids CV:  no edema, +2 pedal pulses   Resp: no accessory muscle use, non-labored,   Skin: no rashes, no areas of induration  Neuro: normal tone, normal sensation to touch Psych:  normal insight, alert and oriented MSK:  Left hand/wrist:  Ecchymosis and swelling over the ulnar aspect of the left hand. Mild tenderness to palpation over the interdigit space between the fourth and fifth metatarsal. No misalignment or malrotation. Normal wrist range of motion. Normal grip strength. Normal strength resistance with finger adduction abduction Neurovascularly intact  Limited ultrasound: Left hand:  Normal appearing fourth and fifth metatarsal. Normal-appearing fourth and fifth MCP joints. No changes appreciated of the fifth middle phalanx. Soft tissue swelling appreciated over the fifth metacarpal shaft  Summary: Soft tissue swelling but no appreciated fracture  Ultrasound and interpretation by Clearance Coots, MD   ASSESSMENT & PLAN:   Left hand pain No changes appreciated on ultrasound.  X-ray showed possible fracture of the phalanx but clinical exam and ultrasound did not.  Likely the result of a significant contusion. -Counseled on home exercise therapy and supportive care. -Could consider occupational therapy if no improvement.

## 2019-12-02 DIAGNOSIS — M79642 Pain in left hand: Secondary | ICD-10-CM | POA: Insufficient documentation

## 2019-12-02 NOTE — Assessment & Plan Note (Signed)
No changes appreciated on ultrasound.  X-ray showed possible fracture of the phalanx but clinical exam and ultrasound did not.  Likely the result of a significant contusion. -Counseled on home exercise therapy and supportive care. -Could consider occupational therapy if no improvement.

## 2019-12-04 NOTE — Progress Notes (Signed)
Cardiology Office Note:    Date:  12/06/2019   ID:  Vincent Gong., DOB June 17, 1941, MRN [id number]  PCP:  Vincent Brock, Vincent Apa, DO  Cardiologist:  Mertie Moores, MD  Electrophysiologist:  None   Referring MD: Vincent Brock, Vincent Brock, *   Chief Complaint: follow-up of CAD   History of Present Illness:    Vincent Brock. is a 78 y.o. male with a history of CAD with remote MI in 2000 s/p  PTCA and stenting of RCA, hypertension, hyperlipidemia, and prostate cancer who is followed by Dr. Acie Fredrickson and presents today for follow-up.  Patient has a history of remote MI back in 2000 at which time he underwent PCI to RCA. He has done well for the past several years. He was last seen by Dr. Acie Fredrickson in 08/2019 at which time he was not exercising and had gained some weight but denied any chest pain or dyspnea. BP was elevated at 158/96. He did admit to eating more salt than he should at that time. HCTZ 25mg  daily was added and patient was encouraged to lose weight. He was advised to follow-up in 3 months with plans to start an ARB if BP still elevated.   Patient presents today for follow-up. Patient doing well from a cardiac standpoint. BP well controlled today at 124/80. Patient denies any chest pain or significant shortness of breath. Our elevators in our building were broken this morning so patient had to climb 3 flights of steps to get to our office and he was able to do this without any problems. Patient reports occasional feeling like his heart is racing if he is short of breath but denies any lightheadedness, dizziness, or near syncope/syncope. No orthopnea, PND, or lower extremity edema.  He does report that he has had 2 falls within the last year with the most recent one being last week. It was a mechanical fall and he reports hitting his foot on curb when getting out of the call. He fell on his left hand and sprained it. Denies any cardiac symptoms prior to fall. Patient also reports more  balance issues and thinks this is due to that fact that he is more deconditioned and has not been very active since COVID started. He has discussed this with his PCP and they talked about possible need for cane/walker but patient would like to try rebuilding his strength with exercise first.  Past Medical History:  Diagnosis Date  . 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 Arthritis   . Chicken pox   . Coronary artery disease    status post PTCA and stenting of his right coronary artery in 2000  . Frequent headaches   . Heart disease   . History of myocardial infarction 07/15/1999   cardiac catherization revealed a tight 99% stenosis in mid right coronary artery -- with successful PTCA and stenting of mid right  . History of prostate cancer   . Hyperlipidemia   . Hypertension   . Kidney stones     Past Surgical History:  Procedure Laterality Date  . APPENDECTOMY    . CORONARY ANGIOPLASTY WITH STENT PLACEMENT  2000   stenting of RCA  . RETINAL DETACHMENT REPAIR W/ SCLERAL BUCKLE LE  06/09/02   Retinal detachment, right eye  . ROBOT ASSISTED LAPAROSCOPIC RADICAL PROSTATECTOMY  07/06/2006   Clinical stage T1C adenocarcinoma of the prostate  . UMBILICAL HERNIA REPAIR  10/17/2004    Current  Medications: Current Meds  Medication Sig  . aspirin 81 MG EC tablet Take 81 mg by mouth daily.    Marland Kitchen atorvastatin (LIPITOR) 80 MG tablet TAKE 1 TABLET BY MOUTH ONCE DAILY AT 6 IN THE EVENING. Please make overdue appt with Dr. Acie Fredrickson before anymore refills. 1st attempt  . Coenzyme Q10 (CO Q 10 PO) Take by mouth daily.  . cyclobenzaprine (FLEXERIL) 10 MG tablet Take 1 tablet (10 mg total) by mouth 2 (two) times daily as needed for muscle spasms.  . diclofenac Sodium (VOLTAREN) 1 % GEL Apply topically as needed.  . hydrochlorothiazide (HYDRODIURIL) 25 MG tablet Take 1 tablet (25 mg total) by mouth daily.  . metoprolol tartrate (LOPRESSOR) 50 MG tablet Take 1/2  (one-half) tablet by mouth twice daily  . nitroGLYCERIN (NITROSTAT) 0.4 MG SL tablet One tablet under tongue up to three times if pain continues after 3 doses call 911  . Omega-3 Fatty Acids (FISH OIL PO) Take by mouth daily. (Total 5,000)  . potassium chloride SA (K-DUR) 20 MEQ tablet Take 1 tablet (20 mEq total) by mouth daily.     Allergies:   Codeine   Social History   Socioeconomic History  . Marital status: Married    Spouse name: Not on file  . Number of children: Not on file  . Years of education: Not on file  . Highest education level: Not on file  Occupational History  . Not on file  Tobacco Use  . Smoking status: Former Smoker    Quit date: 10/22/2005    Years since quitting: 14.1  . Smokeless tobacco: Never Used  Substance and Sexual Activity  . Alcohol use: No  . Drug use: No  . Sexual activity: Yes  Other Topics Concern  . Not on file  Social History Narrative  . Not on file   Social Determinants of Health   Financial Resource Strain:   . Difficulty of Paying Living Expenses: Not on file  Food Insecurity:   . Worried About Charity fundraiser in the Last Year: Not on file  . Ran Out of Food in the Last Year: Not on file  Transportation Needs:   . Lack of Transportation (Medical): Not on file  . Lack of Transportation (Non-Medical): Not on file  Physical Activity:   . Days of Exercise per Week: Not on file  . Minutes of Exercise per Session: Not on file  Stress:   . Feeling of Stress : Not on file  Social Connections:   . Frequency of Communication with Friends and Family: Not on file  . Frequency of Social Gatherings with Friends and Family: Not on file  . Attends Religious Services: Not on file  . Active Member of Clubs or Organizations: Not on file  . Attends Archivist Meetings: Not on file  . Marital Status: Not on file     Family History: The patient's family history includes Cirrhosis in his mother; Coronary artery disease in his  sister; Heart attack (age of onset: 31) in his father; Heart failure in his father; Tuberculosis in his father.  ROS:   Please see the history of present illness.    All other systems reviewed and are negative.  EKGs/Labs/Other Studies Reviewed:    The following studies were reviewed today: N/A.  EKG:  EKG not ordered today.   Recent Labs: 12/13/2018: Hemoglobin 14.3; Platelets 215 09/29/2019: ALT 21; BUN 16; Creatinine, Ser 1.15; Potassium 4.1; Sodium 140  Recent Lipid Panel  Component Value Date/Time   CHOL 121 09/29/2019 0943   TRIG 74 09/29/2019 0943   HDL 48 09/29/2019 0943   CHOLHDL 2.5 09/29/2019 0943   CHOLHDL 2 09/14/2015 0926   VLDL 11.4 09/14/2015 0926   LDLCALC 58 09/29/2019 0943    Physical Exam:    Vital Signs: BP 124/80   Pulse 64   Ht 6\' 1"  (1.854 m)   Wt 199 lb (90.3 kg)   SpO2 99%   BMI 26.25 kg/m     Wt Readings from Last 3 Encounters:  12/06/19 199 lb (90.3 kg)  12/01/19 190 lb (86.2 kg)  11/30/19 198 lb 9.6 oz (90.1 kg)     General: 78 y.o. male in no acute distress. HEENT: Normocephalic and atraumatic. Sclera clear. EOMs intact. Neck: Supple. No carotid bruits.  Heart: RRR. No significant murmurs, gallops, or rubs appreciated. Radial pulses 2+ and equal bilaterally. Lungs: No increased work of breathing. Clear to ausculation bilaterally. No wheezes, rhonchi, or rales.  Abdomen: Soft, non-distended, and non-tender to palpation. Bowel sounds present in all 4 quadrants.  Extremities: No lower extremity edema.    Skin: Warm and dry. Neuro: Alert and oriented x3. No focal deficits. Psych: Normal affect. Responds appropriately.  Assessment:    1. Coronary artery disease involving native coronary artery of native heart without angina pectoris   2. Essential hypertension   3. Hyperlipidemia, unspecified hyperlipidemia type   4. Recurrent falls   5. Balance problems     Plan:    CAD without Angina - History of remote MI in 2000 with PCI  to RCA.  - Stable. No angina. - Continue Aspirin 81mg  daily, Lopressor 25mg  twice daily, and Lipitor 80mg  daily.  Hypertension - BP better controlled since adding HCTZ. BP 124/80 today. - Continue HCTZ 25mg  daily and Lopressor 25mg  twice daily. Continue K-Dur while on diuretic. Recent BMET in 09/2019 showed normal renal function and potassium level. - Patient monitors BP at home but is unsure if home cuff is accurate. He states that it often reads a little higher at home. Advised patient to bring BP cuff to next appointment with [place] or with his PCP so that we can confirm that it accurate.  Hyperlipidemia - Recent lipid panel from 09/2019: Total Cholesterol 121, Triglycerides 74, HDL 48, LDL 58. - At LDL goal of <70. - Continue Lipitor 80mg  daily.  Falls/Balance Issues - Patient reports 2 mechanical falls within the last year with the last one being last week where he tripped on the curb and sprained his left hand. Patient also reports more balance issues lately which he attributes to being deconditioned. He has discussed this with PCP and they talked about patient possibly needing a cane/walker. However, patient would like to try rebuilding his strength first. Encouraged patient to gradually increase activity level with a good goal of 150 minutes of mild/moderate aerobic activity per week. - Patient denies any cardiac symptoms prior to falls and I do not think these falls are cardiac in nature (sounds very mechanical). However, it does not look like patient has had any cardiac evaluation since MI 20 years ago. Did not appreciate any significant murmurs on exam. Patient to let us know if he has any recurrent falls or any palpitations, lightheadedness, dizziness, near syncope/syncope. May consider Echo or heart monitor at that time.   Disposition: Follow up in 6 months with Dr. Acie Fredrickson.   Medication Adjustments/Labs and Tests Ordered: Current medicines are reviewed at length with the patient today.  Concerns regarding medicines are outlined above.  No orders of the defined types were placed in this encounter.  No orders of the defined types were placed in this encounter.   Patient Instructions  Medication Instructions:   Your physician recommends that you continue on your current medications as directed. Please refer to the Current Medication list given to you today.  *If you need a refill on your cardiac medications before your next appointment, please call your pharmacy*  Lab Work:  None ordered today  Testing/Procedures:  None ordered today  Follow-Up: At [facility], you and your health needs are our priority.  As part of our continuing mission to provide you with exceptional heart care, we have created designated Provider Care Teams.  These Care Teams include your primary Cardiologist (physician) and Advanced Practice Providers (APPs -  Physician Assistants and Nurse Practitioners) who all work together to provide you with the care you need, when you need it.  Your next appointment:   6 month(s)  The format for your next appointment:   Either In Person or Virtual  Provider:   You may see Mertie Moores, MD or one of the following Advanced Practice Providers on your designated Care Team:    Richardson Dopp, PA-C  [place], [place]  Daune Perch, NP   Other Instructions  If you have any falls, please call our office at [phone] to let us know. Also, bring your home blood pressure cuff to your next office visit here or your primary care doctor.     Signed, Vincent Mclean, PA-C  12/06/2019 10:56 AM    [facility]

## 2019-12-06 ENCOUNTER — Ambulatory Visit: Payer: PPO | Admitting: Physician Assistant

## 2019-12-06 ENCOUNTER — Encounter: Payer: Self-pay | Admitting: Physician Assistant

## 2019-12-06 ENCOUNTER — Other Ambulatory Visit: Payer: Self-pay

## 2019-12-06 ENCOUNTER — Ambulatory Visit: Payer: PPO | Admitting: Student

## 2019-12-06 VITALS — BP 124/80 | HR 64 | Ht 73.0 in | Wt 199.0 lb

## 2019-12-06 DIAGNOSIS — I2510 Atherosclerotic heart disease of native coronary artery without angina pectoris: Secondary | ICD-10-CM

## 2019-12-06 DIAGNOSIS — I10 Essential (primary) hypertension: Secondary | ICD-10-CM

## 2019-12-06 DIAGNOSIS — R2689 Other abnormalities of gait and mobility: Secondary | ICD-10-CM

## 2019-12-06 DIAGNOSIS — R296 Repeated falls: Secondary | ICD-10-CM | POA: Diagnosis not present

## 2019-12-06 DIAGNOSIS — E785 Hyperlipidemia, unspecified: Secondary | ICD-10-CM

## 2019-12-06 NOTE — Patient Instructions (Signed)
Medication Instructions:   Your physician recommends that you continue on your current medications as directed. Please refer to the Current Medication list given to you today.  *If you need a refill on your cardiac medications before your next appointment, please call your pharmacy*  Lab Work:  None ordered today  Testing/Procedures:  None ordered today  Follow-Up: At [facility], you and your health needs are our priority.  As part of our continuing mission to provide you with exceptional heart care, we have created designated Provider Care Teams.  These Care Teams include your primary Cardiologist (physician) and Advanced Practice Providers (APPs -  Physician Assistants and Nurse Practitioners) who all work together to provide you with the care you need, when you need it.  Your next appointment:   6 month(s)  The format for your next appointment:   Either In Person or Virtual  Provider:   You may see Mertie Moores, MD or one of the following Advanced Practice Providers on your designated Care Team:    Richardson Dopp, PA-C  [place], [place]  Daune Perch, NP   Other Instructions  If you have any falls, please call our office at [phone] to let us know. Also, bring your home blood pressure cuff to your next office visit here or your primary care doctor.

## 2020-01-26 ENCOUNTER — Telehealth: Payer: Self-pay | Admitting: Family Medicine

## 2020-01-26 NOTE — Telephone Encounter (Signed)
Called patient to schedule AWV, but no answer. Will call patient back at a later time.

## 2020-05-24 ENCOUNTER — Other Ambulatory Visit: Payer: Self-pay | Admitting: Cardiovascular Disease

## 2020-06-12 ENCOUNTER — Other Ambulatory Visit: Payer: Self-pay

## 2020-06-12 ENCOUNTER — Encounter: Payer: Self-pay | Admitting: Cardiovascular Disease

## 2020-06-12 ENCOUNTER — Ambulatory Visit: Payer: PPO | Admitting: Cardiovascular Disease

## 2020-06-12 VITALS — BP 122/82 | HR 86 | Ht 73.0 in | Wt 200.8 lb

## 2020-06-12 DIAGNOSIS — E785 Hyperlipidemia, unspecified: Secondary | ICD-10-CM | POA: Diagnosis not present

## 2020-06-12 DIAGNOSIS — I2510 Atherosclerotic heart disease of native coronary artery without angina pectoris: Secondary | ICD-10-CM | POA: Diagnosis not present

## 2020-06-12 DIAGNOSIS — I10 Essential (primary) hypertension: Secondary | ICD-10-CM | POA: Diagnosis not present

## 2020-06-12 LAB — HEPATIC FUNCTION PANEL
ALT: 18 IU/L (ref 0–44)
AST: 21 IU/L (ref 0–40)
Albumin: 4.5 g/dL (ref 3.7–4.7)
Alkaline Phosphatase: 98 IU/L (ref 48–121)
Bilirubin Total: 0.7 mg/dL (ref 0.0–1.2)
Bilirubin, Direct: 0.22 mg/dL (ref 0.00–0.40)
Total Protein: 6.8 g/dL (ref 6.0–8.5)

## 2020-06-12 LAB — BASIC METABOLIC PANEL
BUN/Creatinine Ratio: 12 (ref 10–24)
BUN: 12 mg/dL (ref 8–27)
CO2: 24 mmol/L (ref 20–29)
Calcium: 9.5 mg/dL (ref 8.6–10.2)
Chloride: 101 mmol/L (ref 96–106)
Creatinine, Ser: 0.97 mg/dL (ref 0.76–1.27)
GFR calc Af Amer: 86 mL/min/{1.73_m2} (ref >59)
GFR calc non Af Amer: 74 mL/min/{1.73_m2} (ref >59)
Glucose: 115 mg/dL — ABNORMAL HIGH (ref 65–99)
Potassium: 4.7 mmol/L (ref 3.5–5.2)
Sodium: 139 mmol/L (ref 134–144)

## 2020-06-12 LAB — LIPID PANEL
Chol/HDL Ratio: 2.6 ratio (ref 0.0–5.0)
Cholesterol, Total: 129 mg/dL (ref 100–199)
HDL: 49 mg/dL (ref >39)
LDL Chol Calc (NIH): 57 mg/dL (ref 0–99)
Triglycerides: 129 mg/dL (ref 0–149)
VLDL Cholesterol Cal: 23 mg/dL (ref 5–40)

## 2020-06-12 NOTE — Patient Instructions (Signed)
Medication Instructions:  Your physician recommends that you continue on your current medications as directed. Please refer to the Current Medication list given to you today.  *If you need a refill on your cardiac medications before your next appointment, please call your pharmacy*   Lab Work: TODAY - cholesterol, liver panel, basic metabolic panel If you have labs (blood work) drawn today and your tests are completely normal, you will receive your results only by: MyChart Message (if you have MyChart) OR A paper copy in the mail If you have any lab test that is abnormal or we need to change your treatment, we will call you to review the results.   Testing/Procedures: None Ordered   Follow-Up: At [facility], you and your health needs are our priority.  As part of our continuing mission to provide you with exceptional heart care, we have created designated Provider Care Teams.  These Care Teams include your primary Cardiologist (physician) and Advanced Practice Providers (APPs -  Physician Assistants and Nurse Practitioners) who all work together to provide you with the care you need, when you need it.  Your next appointment:   1 year(s)  The format for your next appointment:   In Person  Provider:   You may see Philip Nahser, MD or one of the following Advanced Practice Providers on your designated Care Team:   Scott Weaver, PA-C Vin Bhagat, PA-C   

## 2020-06-12 NOTE — Progress Notes (Signed)
Vincent Brock. Date of Birth  08-03-1941 [facility]        [address]    [place]     [place], [place]  [address]      [phone]  Fax  [phone]     Problem list:  1. Coronary artery disease: Status post PTCA and stenting of the RCA in 2000. Who placed a 4.0 x 18 mm NIR Royal post dilated up to 4.4 mm 2. Hyperlipidemia 3. Hypertension 4. Prostate cancer   79 yo with cad.  No chest pain.  Works out every day.  Had knee surgery several months ago.    Sept. 12, 2014:  No angina.  Has had some BP issues - up and down.   Typical readings are 130 / 85.  Lots of anxiety at night.   He has been going to the [facility].    Sept. 17, 2015:  Doing well.  No dyspnea, no CP,  Fatigues eaily.   Working out at [employer].  Sept. 27, 2016:  Doing well.  No CP ,  BP is a bit higher today . Typically is i the normal range.   Jan. 25, 2018:   Rush Landmark is doing well.   Has not been exercising as much.  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  Wears him out mentally - is not getting much exercise.  BP has been good   Apr 21, 2018:  Rush Landmark is seen today for follow-up visit.  He has a history of coronary artery disease with stenting. 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 11, 2020   Rush Landmark is seen today.  No CP , no dyspnea.  Is not exercising  Has gained some weight.  May be eating more salt,  Is definitely more anxious .  Has not been going to the [facility]   June 12, 2020 Rush Landmark is seen today for follow up for his CAD, HLD, HTN. No cp , no dyspnea,  Is not exercising much  Quit driving for [place] 8 months ago .  Has gotten "lazy"    Current Outpatient Medications on File Prior to Visit  Medication Sig Dispense Refill  . aspirin 81 MG EC tablet Take 81 mg by mouth daily.      Marland Kitchen atorvastatin (LIPITOR) 80 MG tablet TAKE 1  TABLET BY MOUTH ONCE DAILY 6 IN THE EVENING 90 tablet 0  . Coenzyme Q10 (CO Q 10 PO) Take by mouth daily.    . cyclobenzaprine (FLEXERIL) 10 MG tablet Take 1 tablet (10 mg total) by mouth 2 (two) times daily as needed for muscle spasms. 10 tablet 0  . diclofenac Sodium (VOLTAREN) 1 % GEL Apply topically as needed.    . hydrochlorothiazide (HYDRODIURIL) 25 MG tablet Take 1 tablet (25 mg total) by mouth daily. 90 tablet 3  . metoprolol tartrate (LOPRESSOR) 50 MG tablet Take 1/2 (one-half) tablet by mouth twice daily 90 tablet 3  . nitroGLYCERIN (NITROSTAT) 0.4 MG SL tablet One tablet under tongue up to three times if pain continues after 3 doses call 911 25 tablet 3  . Omega-3 Fatty Acids (FISH OIL PO) Take by mouth daily. (Total 5,000)    . potassium chloride SA (K-DUR) 20 MEQ tablet  Take 1 tablet (20 mEq total) by mouth daily. 90 tablet 3   No current facility-administered medications on file prior to visit.    Allergies  Allergen Reactions  . Codeine Other (See Comments)    "can't relax or sleep"    Past Medical History:  Diagnosis Date  . Adenocarcinoma of prostate ([place])    Clinical stage T1C adenocarcinoma  . Arthritis   . Chicken pox   . Coronary artery disease    status post PTCA and stenting of his right coronary artery in 2000  . Frequent headaches   . Heart disease   . History of myocardial infarction 07/15/1999   cardiac catherization revealed a tight 99% stenosis in mid right coronary artery -- with successful PTCA and stenting of mid right  . History of prostate cancer   . Hyperlipidemia   . Hypertension   . Kidney stones     Past Surgical History:  Procedure Laterality Date  . APPENDECTOMY    . CORONARY ANGIOPLASTY WITH STENT PLACEMENT  2000   stenting of RCA  . RETINAL DETACHMENT REPAIR W/ SCLERAL BUCKLE LE  06/09/02   Retinal detachment, right eye  . ROBOT ASSISTED LAPAROSCOPIC RADICAL PROSTATECTOMY  07/06/2006   Clinical stage T1C adenocarcinoma of the  prostate  . UMBILICAL HERNIA REPAIR  10/17/2004    Social History   Tobacco Use  Smoking Status Former Smoker  . Quit date: 10/22/2005  . Years since quitting: 14.6  Smokeless Tobacco Never Used    Social History   Substance and Sexual Activity  Alcohol Use No    Family History  Problem Relation Age of Onset  . Heart attack Father 73  . Tuberculosis Father   . Heart failure Father   . Cirrhosis Mother   . Coronary artery disease Sister        with stents    Reviw of Systems:  Reviewed in the HPI.  All other systems are negative.  Physical Exam: There were no vitals taken for this visit.  GEN:  Well nourished, well developed in no acute distress HEENT: Normal NECK: No JVD; No carotid bruits LYMPHATICS: No lymphadenopathy CARDIAC: RRR , no murmurs, rubs, gallops RESPIRATORY:  Clear to auscultation without rales, wheezing or rhonchi  ABDOMEN: Soft, non-tender, non-distended MUSCULOSKELETAL:  No edema; No deformity  SKIN: Warm and dry NEUROLOGIC:  Alert and oriented x 3    ECG:    Assessment / Plan:   1. Coronary artery disease: Status post PTCA and stenting of the RCA in 2000. Who placed a 4.0 x 18 mm NIR Royal post dilated up to 4.4 mm Overall he is doing well.  Is not having any episodes of chest pain or shortness of breath.    2. Hyperlipidemia -       Labs from last October were reviewed.  His LDL is 58.  We will recheck lipids, liver enzymes, basic metabolic profile today.  3. Hypertension: .  Blood pressure is well controlled.   4. Prostate cancer Plans per Dr. Diona Fanti.    Mertie Moores, MD  06/12/2020 7:41 AM    [address] [place],  [place], [place]  [address] Pager [phone] Phone: [phone]; Fax: [phone]

## 2020-08-25 ENCOUNTER — Other Ambulatory Visit: Payer: Self-pay | Admitting: Cardiovascular Disease

## 2020-08-28 ENCOUNTER — Other Ambulatory Visit: Payer: Self-pay

## 2020-08-28 MED ORDER — POTASSIUM CHLORIDE CRYS ER 20 MEQ PO TBCR: 20.0000 meq | EXTENDED_RELEASE_TABLET | Freq: Every day | ORAL | 2 refills | Status: DC

## 2020-08-28 MED ORDER — HYDROCHLOROTHIAZIDE 25 MG PO TABS: 25.0000 mg | ORAL_TABLET | Freq: Every day | ORAL | 2 refills | Status: DC

## 2020-09-03 ENCOUNTER — Other Ambulatory Visit: Payer: Self-pay | Admitting: Cardiovascular Disease

## 2020-12-24 ENCOUNTER — Other Ambulatory Visit: Payer: Self-pay | Admitting: Cardiovascular Disease

## 2020-12-31 ENCOUNTER — Other Ambulatory Visit (HOSPITAL_BASED_OUTPATIENT_CLINIC_OR_DEPARTMENT_OTHER): Payer: Self-pay | Admitting: Internal Medicine

## 2020-12-31 ENCOUNTER — Ambulatory Visit: Payer: PPO | Attending: Internal Medicine

## 2020-12-31 DIAGNOSIS — Z23 Encounter for immunization: Secondary | ICD-10-CM

## 2020-12-31 NOTE — Progress Notes (Signed)
   [facility]  Name:  Damiel Barthold.    MRN: [id number] DOB: 04-21-1941  12/31/2020  Mr. Weissberg was observed post Covid-19 immunization for 15 minutes without incident. He was provided with Vaccine Information Sheet and instruction to access the V-Safe system.   Mr. Leon was instructed to call 911 with any severe reactions post vaccine: Marland Kitchen Difficulty breathing  . Swelling of face and throat  . A fast heartbeat  . A bad rash all over body  . Dizziness and weakness   Immunizations Administered    Name Date Dose VIS Date Route   Moderna Covid-19 Booster Vaccine 12/31/2020 12:23 PM 0.25 mL 10/10/2020 Intramuscular   Manufacturer: Levan Hurst   Lot: [id number]   [place]: [id number]

## 2021-01-01 MED FILL — MODERNA COVID-19 VACCINE 10: 100 | 28 days supply | Qty: 0 | Fill #0

## 2021-05-30 ENCOUNTER — Other Ambulatory Visit: Payer: Self-pay | Admitting: Cardiovascular Disease

## 2021-06-24 ENCOUNTER — Other Ambulatory Visit: Payer: Self-pay | Admitting: Cardiovascular Disease

## 2021-06-25 ENCOUNTER — Other Ambulatory Visit: Payer: Self-pay

## 2021-06-25 MED ORDER — METOPROLOL TARTRATE 50 MG PO TABS: 25.0000 mg | ORAL_TABLET | Freq: Two times a day (BID) | ORAL | 0 refills | Status: DC

## 2021-07-08 ENCOUNTER — Encounter: Payer: Self-pay | Admitting: Cardiovascular Disease

## 2021-07-08 NOTE — Progress Notes (Signed)
Vincent Brock. Date of Birth  1941/02/09 [facility]        [address]    [place]     [place], [place]  [address]      [phone]  Fax  [phone]     Problem list:  1. Coronary artery disease: Status post PTCA and stenting of the RCA in 2000. Who placed a 4.0 x 18 mm NIR Royal post dilated up to 4.4 mm 2. Hyperlipidemia 3. Hypertension 4. Prostate cancer   80 yo with cad.  No chest pain.  Works out every day.  Had knee surgery several months ago.    Sept. 12, 2014:  No angina.  Has had some BP issues - up and down.   Typical readings are 130 / 85.  Lots of anxiety at night.   He has been going to the [facility].    Sept. 17, 2015:  Doing well.  No dyspnea, no CP,  Fatigues eaily.   Working out at [employer].  Sept. 27, 2016:  Doing well.  No CP ,  BP is a bit higher today . Typically is i the normal range.   Jan. 25, 2018:   Vincent Brock is doing well.   Has not been exercising as much.  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  Wears him out mentally - is not getting much exercise.  BP has been good   Apr 21, 2018:  Vincent Brock is seen today for follow-up visit.  He has a history of coronary artery disease with stenting. 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 11, 2020   Vincent Brock is seen today.  No CP , no dyspnea.  Is not exercising  Has gained some weight.  May be eating more salt,  Is definitely more anxious .  Has not been going to the [facility]   June 12, 2020 Vincent Brock is seen today for follow up for his CAD, HLD, HTN. 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  Has gotten "lazy"   July 09, 2021: Vincent Brock is seen today for follow up for his CAD, HLD, HTN Not much exercise . No angina     Current Outpatient Medications on File Prior to Visit  Medication Sig Dispense  Refill   aspirin 81 MG EC tablet Take 81 mg by mouth daily.       atorvastatin (LIPITOR) 80 MG tablet TAKE 1 TABLET BY MOUTH ONCE DAILY IN THE EVENING 90 tablet 3   Coenzyme Q10 (CO Q 10 PO) Take by mouth daily.     COVID-19 mRNA vaccine, Moderna, 100 MCG/0.5ML injection INJECT AS DIRECTED .25 mL 0   cyclobenzaprine (FLEXERIL) 10 MG tablet Take 1 tablet (10 mg total) by mouth 2 (two) times daily as needed for muscle spasms. 10 tablet 0   diclofenac Sodium (VOLTAREN) 1 % GEL Apply topically as needed.     hydrochlorothiazide (HYDRODIURIL) 25 MG tablet Take 1 tablet (25 mg total) by mouth daily. Pt needs to keep upcoming appt in July for further refills 30 tablet 1   metoprolol tartrate (LOPRESSOR) 50 MG tablet Take 0.5 tablets (25 mg total) by mouth 2 (two) times daily. Please keep  upcoming appt in July 2022 with Dr. Acie Fredrickson before anymore refills. Thank you Final Attempt 90 tablet 0   nitroGLYCERIN (NITROSTAT) 0.4 MG SL tablet One tablet under tongue up to three times if pain continues after 3 doses call 911 25 tablet 3   Omega-3 Fatty Acids (FISH OIL PO) Take by mouth daily. (Total 5,000)     potassium chloride SA (KLOR-CON) 20 MEQ tablet Take 1 tablet (20 mEq total) by mouth daily. Pt needs to keep upcoming appt in July for further refills 30 tablet 1   No current facility-administered medications on file prior to visit.    Allergies  Allergen Reactions   Codeine Other (See Comments)    "can't relax or sleep"    Past Medical History:  Diagnosis Date   Adenocarcinoma of prostate ([place])    Clinical stage T1C adenocarcinoma   Arthritis    Chicken pox    Coronary artery disease    status post PTCA and stenting of his right coronary artery in 2000   Frequent headaches    Heart disease    History of myocardial infarction 07/15/1999   cardiac catherization revealed a tight 99% stenosis in mid right coronary artery -- with successful PTCA and stenting of mid right   History of prostate  cancer    Hyperlipidemia    Hypertension    Kidney stones     Past Surgical History:  Procedure Laterality Date   APPENDECTOMY     CORONARY ANGIOPLASTY WITH STENT PLACEMENT  2000   stenting of RCA   RETINAL DETACHMENT REPAIR W/ SCLERAL BUCKLE LE  06/09/02   Retinal detachment, right eye   ROBOT ASSISTED LAPAROSCOPIC RADICAL PROSTATECTOMY  07/06/2006   Clinical stage T1C adenocarcinoma of the prostate   UMBILICAL HERNIA REPAIR  10/17/2004    Social History   Tobacco Use  Smoking Status Former   Types: Cigarettes   Quit date: 10/22/2005   Years since quitting: 15.7  Smokeless Tobacco Never    Social History   Substance and Sexual Activity  Alcohol Use No    Family History  Problem Relation Age of Onset   Heart attack Father 9   Tuberculosis Father    Heart failure Father    Cirrhosis Mother    Coronary artery disease Sister        with stents    Reviw of Systems:  Reviewed in the HPI.  All other systems are negative.  Physical Exam: Blood pressure 128/82, pulse 61, height 6\' 1"  (1.854 m), weight 197 lb 12.8 oz (89.7 kg), SpO2 96 %.  GEN:  Well nourished, well developed in no acute distress HEENT: Normal NECK: No JVD; No carotid bruits LYMPHATICS: No lymphadenopathy CARDIAC: RRR , no murmurs, rubs, gallops RESPIRATORY:  Clear to auscultation without rales, wheezing or rhonchi  ABDOMEN: Soft, non-tender, non-distended MUSCULOSKELETAL:  No edema; No deformity  SKIN: Warm and dry NEUROLOGIC:  Alert and oriented x 3     ECG:  July 09, 2021:   NSR at 66.  No ST or T wave changes.     Assessment / Plan:   1. Coronary artery disease: Status post PTCA and stenting of the RCA in 2000.  4.0 x 18 mm NIR Royal post dilated up to 4.4 mm No angina  Encouraged more exercise   2. Hyperlipidemia -       Last LDL was 57.    Cont atorvastatin  Check lipids, ALT , BMP today    3. Hypertension: .  BP is well controlled.    4. Prostate cancer Plans per Dr.  Diona Fanti.    Mertie Moores, MD  07/09/2021 11:44 AM    [place] [place],  [place], [place]  [address] Pager [phone] Phone: [phone]; Fax: [phone]

## 2021-07-09 ENCOUNTER — Ambulatory Visit: Payer: PPO | Admitting: Cardiovascular Disease

## 2021-07-09 ENCOUNTER — Other Ambulatory Visit: Payer: Self-pay

## 2021-07-09 VITALS — BP 128/82 | HR 61 | Ht 73.0 in | Wt 197.8 lb

## 2021-07-09 DIAGNOSIS — I2510 Atherosclerotic heart disease of native coronary artery without angina pectoris: Secondary | ICD-10-CM | POA: Diagnosis not present

## 2021-07-09 DIAGNOSIS — I10 Essential (primary) hypertension: Secondary | ICD-10-CM

## 2021-07-09 DIAGNOSIS — E785 Hyperlipidemia, unspecified: Secondary | ICD-10-CM | POA: Diagnosis not present

## 2021-07-09 LAB — BASIC METABOLIC PANEL
BUN/Creatinine Ratio: 14 (ref 10–24)
BUN: 15 mg/dL (ref 8–27)
CO2: 25 mmol/L (ref 20–29)
Calcium: 9.5 mg/dL (ref 8.6–10.2)
Chloride: 100 mmol/L (ref 96–106)
Creatinine, Ser: 1.08 mg/dL (ref 0.76–1.27)
Glucose: 106 mg/dL — ABNORMAL HIGH (ref 65–99)
Potassium: 5 mmol/L (ref 3.5–5.2)
Sodium: 139 mmol/L (ref 134–144)
eGFR: 70 mL/min/{1.73_m2} (ref >59)

## 2021-07-09 LAB — ALT: ALT: 17 IU/L (ref 0–44)

## 2021-07-09 LAB — LIPID PANEL
Chol/HDL Ratio: 2.4 ratio (ref 0.0–5.0)
Cholesterol, Total: 121 mg/dL (ref 100–199)
HDL: 50 mg/dL (ref >39)
LDL Chol Calc (NIH): 54 mg/dL (ref 0–99)
Triglycerides: 86 mg/dL (ref 0–149)
VLDL Cholesterol Cal: 17 mg/dL (ref 5–40)

## 2021-07-09 NOTE — Patient Instructions (Signed)
Medication Instructions:  Your physician recommends that you continue on your current medications as directed. Please refer to the Current Medication list given to you today.  *If you need a refill on your cardiac medications before your next appointment, please call your pharmacy*   Lab Work: BMET, LIPIDS, ALT today. If you have labs (blood work) drawn today and your tests are completely normal, you will receive your results only by: [place] (if you have MyChart) OR A paper copy in the mail If you have any lab test that is abnormal or we need to change your treatment, we will call you to review the results.   Testing/Procedures: None today   Follow-Up: At [facility], you and your health needs are our priority.  As part of our continuing mission to provide you with exceptional heart care, we have created designated Provider Care Teams.  These Care Teams include your primary Cardiologist (physician) and Advanced Practice Providers (APPs -  Physician Assistants and Nurse Practitioners) who all work together to provide you with the care you need, when you need it.  We recommend signing up for the patient portal called "MyChart".  Sign up information is provided on this After Visit Summary.  MyChart is used to connect with patients for Virtual Visits (Telemedicine).  Patients are able to view lab/test results, encounter notes, upcoming appointments, etc.  Non-urgent messages can be sent to your provider as well.   To learn more about what you can do with MyChart, go to [email or web address].    Your next appointment:   1 year(s)  The format for your next appointment:   In Person  Provider:   Mertie Moores, MD   Other Instructions None today

## 2021-07-30 ENCOUNTER — Other Ambulatory Visit: Payer: Self-pay | Admitting: Cardiovascular Disease

## 2021-09-06 ENCOUNTER — Other Ambulatory Visit: Payer: Self-pay | Admitting: Cardiovascular Disease

## 2021-10-12 ENCOUNTER — Other Ambulatory Visit: Payer: Self-pay | Admitting: Cardiovascular Disease

## 2021-10-14 ENCOUNTER — Other Ambulatory Visit: Payer: Self-pay | Admitting: Cardiovascular Disease

## 2021-12-27 DIAGNOSIS — Z8546 Personal history of malignant neoplasm of prostate: Secondary | ICD-10-CM | POA: Diagnosis not present

## 2021-12-27 DIAGNOSIS — N5231 Erectile dysfunction following radical prostatectomy: Secondary | ICD-10-CM | POA: Diagnosis not present

## 2021-12-27 DIAGNOSIS — C61 Malignant neoplasm of prostate: Secondary | ICD-10-CM | POA: Diagnosis not present

## 2022-06-03 ENCOUNTER — Other Ambulatory Visit: Payer: Self-pay | Admitting: Cardiovascular Disease

## 2022-07-07 ENCOUNTER — Other Ambulatory Visit: Payer: Self-pay | Admitting: Cardiovascular Disease

## 2022-07-09 ENCOUNTER — Other Ambulatory Visit: Payer: Self-pay

## 2022-07-09 MED ORDER — METOPROLOL TARTRATE 50 MG PO TABS: 50.0000 mg | ORAL_TABLET | Freq: Two times a day (BID) | ORAL | 0 refills | Status: DC

## 2022-07-11 MED ORDER — METOPROLOL TARTRATE 25 MG PO TABS: 25.0000 mg | ORAL_TABLET | Freq: Two times a day (BID) | ORAL | 0 refills | Status: DC

## 2022-07-11 NOTE — Telephone Encounter (Signed)
If pt is supposed to be taking metoprolol 25 mg tablets, this was never changed on pt's med list. Could you please correct this on the med list? Thanks

## 2022-07-11 NOTE — Telephone Encounter (Signed)
Per last OV note:  metoprolol tartrate (LOPRESSOR) 50 MG tablet Take 0.5 tablets (25 mg total) by mouth 2 (two) times daily. Please keep upcoming appt in July 2022 with Dr. Acie Fredrickson before anymore refills. Thank you Final Attempt 90 tablet  Patient's total daily dose is '50mg'$ , divided into two equal doses of '25mg'$ . Pt is overdue for an appt w/[facility]. Rx was sent in to cover pt until he can schedule appt, but was sent in as '50mg'$  BID. Called pharmacy to cancel that Rx and will re-send in correct daily dosing. Changing to '25mg'$  tablets to eliminate confusion.

## 2022-07-26 ENCOUNTER — Other Ambulatory Visit: Payer: Self-pay | Admitting: Cardiovascular Disease

## 2022-07-29 MED ORDER — POTASSIUM CHLORIDE CRYS ER 20 MEQ PO TBCR: EXTENDED_RELEASE_TABLET | ORAL | 0 refills | Status: DC

## 2022-08-17 ENCOUNTER — Other Ambulatory Visit: Payer: Self-pay | Admitting: Cardiovascular Disease

## 2022-08-25 ENCOUNTER — Encounter: Payer: Self-pay | Admitting: Cardiovascular Disease

## 2022-08-25 NOTE — Progress Notes (Signed)
This encounter was created in error - please disregard.

## 2022-08-26 ENCOUNTER — Encounter: Payer: PPO | Admitting: Cardiovascular Disease

## 2022-09-03 ENCOUNTER — Encounter: Payer: Self-pay | Admitting: Cardiovascular Disease

## 2022-09-03 NOTE — Progress Notes (Signed)
Vincent Brock. Date of Birth  1941-03-01 [facility]        [address]    [place]     [place], [place]  [address]      [phone]  Fax  [phone]     Problem list:  1. Coronary artery disease: Status post PTCA and stenting of the RCA in 2000. Who placed a 4.0 x 18 mm NIR Royal post dilated up to 4.4 mm 2. Hyperlipidemia 3. Hypertension 4. Prostate cancer   81 yo with cad.  No chest pain.  Works out every day.  Had knee surgery several months ago.    Sept. 12, 2014:  No angina.  Has had some BP issues - up and down.   Typical readings are 130 / 85.  Lots of anxiety at night.   He has been going to the [facility].    Sept. 17, 2015:  Doing well.  No dyspnea, no CP,  Fatigues eaily.   Working out at [employer].  Sept. 27, 2016:  Doing well.  No CP ,  BP is a bit higher today . Typically is i the normal range.   Jan. 25, 2018:   Vincent Brock is doing well.   Has not been exercising as much.  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  Wears him out mentally - is not getting much exercise.  BP has been good   Apr 21, 2018:  Vincent Brock is seen today for follow-up visit.  He has a history of coronary artery disease with stenting. 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 11, 2020   Vincent Brock is seen today.  No CP , no dyspnea.  Is not exercising  Has gained some weight.  May be eating more salt,  Is definitely more anxious .  Has not been going to the [facility]   June 12, 2020 Vincent Brock is seen today for follow up for his CAD, HLD, HTN. 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  Has gotten "lazy"   July 09, 2021: Vincent Brock is seen today for follow up for his CAD, HLD, HTN Not much exercise . No angina   Sept. 5, 2023 Vincent Brock is seen today for follow up of his CAD, HLD, HTN   Sept. 14,  2023 Vincent Brock is seen today for follow up of his HTN, CAD, HLD , RBBB   No CP , no dyspnea.  Wife Loletha Carrow has progrossive dementia  Had breast surgery 6 months ago - she did well with the surgery      Current Outpatient Medications on File Prior to Visit  Medication Sig Dispense Refill   aspirin 81 MG EC tablet Take 81 mg by mouth daily.       atorvastatin (LIPITOR) 80 MG tablet TAKE 1 TABLET BY MOUTH ONCE DAILY IN THE EVENING 90 tablet 3   Coenzyme Q10 (CO Q 10 PO) Take by mouth daily.     cyclobenzaprine (FLEXERIL) 10 MG tablet Take 1 tablet (10 mg total) by mouth 2 (two) times daily as needed for muscle spasms. 10 tablet 0   metoprolol tartrate (LOPRESSOR) 25 MG tablet Take 1 tablet (25 mg total) by mouth  2 (two) times daily. 60 tablet 0   nitroGLYCERIN (NITROSTAT) 0.4 MG SL tablet One tablet under tongue up to three times if pain continues after 3 doses call 911 25 tablet 3   Omega-3 Fatty Acids (FISH OIL PO) Take by mouth daily. (Total 5,000)     potassium chloride SA (KLOR-CON M) 20 MEQ tablet TAKE 1  BY MOUTH ONCE DAILY 90 tablet 0   diclofenac Sodium (VOLTAREN) 1 % GEL Apply topically as needed. (Patient not taking: Reported on 09/04/2022)     hydrochlorothiazide (HYDRODIURIL) 25 MG tablet Take 1 tablet by mouth once daily (Patient not taking: Reported on 09/04/2022) 90 tablet 0   No current facility-administered medications on file prior to visit.    Allergies  Allergen Reactions   Codeine Other (See Comments)    "can't relax or sleep"    Past Medical History:  Diagnosis Date   Adenocarcinoma of prostate ([place])    Clinical stage T1C adenocarcinoma   Arthritis    Chicken pox    Coronary artery disease    status post PTCA and stenting of his right coronary artery in 2000   Frequent headaches    Heart disease    History of myocardial infarction 07/15/1999   cardiac catherization revealed a tight 99% stenosis in mid right coronary artery -- with successful PTCA and stenting of  mid right   History of prostate cancer    Hyperlipidemia    Hypertension    Kidney stones     Past Surgical History:  Procedure Laterality Date   APPENDECTOMY     CORONARY ANGIOPLASTY WITH STENT PLACEMENT  2000   stenting of RCA   RETINAL DETACHMENT REPAIR W/ SCLERAL BUCKLE LE  06/09/02   Retinal detachment, right eye   ROBOT ASSISTED LAPAROSCOPIC RADICAL PROSTATECTOMY  07/06/2006   Clinical stage T1C adenocarcinoma of the prostate   UMBILICAL HERNIA REPAIR  10/17/2004    Social History   Tobacco Use  Smoking Status Former   Types: Cigarettes   Quit date: 10/22/2005   Years since quitting: 16.8  Smokeless Tobacco Never    Social History   Substance and Sexual Activity  Alcohol Use No    Family History  Problem Relation Age of Onset   Heart attack Father 84   Tuberculosis Father    Heart failure Father    Cirrhosis Mother    Coronary artery disease Sister        with stents    Reviw of Systems:  Reviewed in the HPI.  All other systems are negative.  Physical Exam: Blood pressure 122/86, pulse 63, height '6\' 1"'$  (1.854 m), weight 190 lb (86.2 kg), SpO2 96 %.     GEN:  Well nourished, well developed in no acute distress HEENT: Normal NECK: No JVD; No carotid bruits LYMPHATICS: No lymphadenopathy CARDIAC: RRR , no murmurs, rubs, gallops RESPIRATORY:  Clear to auscultation without rales, wheezing or rhonchi  ABDOMEN: Soft, non-tender, non-distended MUSCULOSKELETAL:  No edema; No deformity  SKIN: Warm and dry NEUROLOGIC:  Alert and oriented x 3  ECG:   September 04, 2022: Normal sinus rhythm at 63 with a first-degree AV block.  Right bundle branch block.  Old inferior wall myocardial infarction.    Assessment / Plan:   1. Coronary artery disease: Status post PTCA and stenting of the RCA in 2000.  4.0 x 18 mm NIR Royal post dilated up to 4.4 mm  No angina .  Cont current activity  2. Hyperlipidemia -      draw labs today , cont atorvastatin    3.  Hypertension: . BP has been well controlled.      4. Prostate cancer:  stable      Mertie Moores, MD  09/04/2022 11:15 AM    [address] [place],  [place], [place]  [address] Pager [phone] Phone: [phone]; Fax: [phone]

## 2022-09-04 ENCOUNTER — Ambulatory Visit: Payer: PPO | Attending: Cardiovascular Disease | Admitting: Cardiovascular Disease

## 2022-09-04 ENCOUNTER — Encounter: Payer: Self-pay | Admitting: Cardiovascular Disease

## 2022-09-04 VITALS — BP 122/86 | HR 63 | Ht 73.0 in | Wt 190.0 lb

## 2022-09-04 DIAGNOSIS — I10 Essential (primary) hypertension: Secondary | ICD-10-CM

## 2022-09-04 DIAGNOSIS — E785 Hyperlipidemia, unspecified: Secondary | ICD-10-CM | POA: Diagnosis not present

## 2022-09-04 DIAGNOSIS — I2510 Atherosclerotic heart disease of native coronary artery without angina pectoris: Secondary | ICD-10-CM

## 2022-09-04 LAB — BASIC METABOLIC PANEL
BUN/Creatinine Ratio: 12 (ref 10–24)
BUN: 13 mg/dL (ref 8–27)
CO2: 26 mmol/L (ref 20–29)
Calcium: 9.6 mg/dL (ref 8.6–10.2)
Chloride: 102 mmol/L (ref 96–106)
Creatinine, Ser: 1.1 mg/dL (ref 0.76–1.27)
Glucose: 117 mg/dL — ABNORMAL HIGH (ref 70–99)
Potassium: 4.8 mmol/L (ref 3.5–5.2)
Sodium: 139 mmol/L (ref 134–144)
eGFR: 68 mL/min/{1.73_m2} (ref >59)

## 2022-09-04 LAB — ALT: ALT: 16 IU/L (ref 0–44)

## 2022-09-04 LAB — LIPID PANEL
Chol/HDL Ratio: 2.3 ratio (ref 0.0–5.0)
Cholesterol, Total: 129 mg/dL (ref 100–199)
HDL: 57 mg/dL (ref >39)
LDL Chol Calc (NIH): 57 mg/dL (ref 0–99)
Triglycerides: 75 mg/dL (ref 0–149)
VLDL Cholesterol Cal: 15 mg/dL (ref 5–40)

## 2022-09-04 NOTE — Patient Instructions (Signed)
Medication Instructions:  Your physician recommends that you continue on your current medications as directed. Please refer to the Current Medication list given to you today.  *If you need a refill on your cardiac medications before your next appointment, please call your pharmacy*   Lab Work: Lipids, ALT, BMET Today If you have labs (blood work) drawn today and your tests are completely normal, you will receive your results only by: [place] (if you have MyChart) OR A paper copy in the mail If you have any lab test that is abnormal or we need to change your treatment, we will call you to review the results.   Testing/Procedures: NONE   Follow-Up: At [facility], you and your health needs are our priority.  As part of our continuing mission to provide you with exceptional heart care, we have created designated Provider Care Teams.  These Care Teams include your primary Cardiologist (physician) and Advanced Practice Providers (APPs -  Physician Assistants and Nurse Practitioners) who all work together to provide you with the care you need, when you need it.  Your next appointment:   1 year(s)  The format for your next appointment:   In Person  Provider:   Mertie Moores, MD       Important Information About Sugar

## 2022-09-07 ENCOUNTER — Other Ambulatory Visit: Payer: Self-pay | Admitting: Cardiovascular Disease

## 2022-09-26 ENCOUNTER — Other Ambulatory Visit: Payer: Self-pay | Admitting: Cardiovascular Disease

## 2022-10-25 ENCOUNTER — Other Ambulatory Visit: Payer: Self-pay | Admitting: Cardiovascular Disease

## 2023-04-06 ENCOUNTER — Encounter: Payer: Self-pay | Admitting: *Deleted

## 2023-09-02 ENCOUNTER — Other Ambulatory Visit: Payer: Self-pay | Admitting: Cardiovascular Disease

## 2023-09-02 NOTE — Telephone Encounter (Signed)
Please contact pt for future appointment. Pt due for follow up.

## 2023-09-30 ENCOUNTER — Other Ambulatory Visit: Payer: Self-pay | Admitting: Cardiovascular Disease

## 2023-10-09 ENCOUNTER — Other Ambulatory Visit: Payer: Self-pay | Admitting: Cardiovascular Disease

## 2023-10-14 ENCOUNTER — Other Ambulatory Visit: Payer: Self-pay | Admitting: Cardiovascular Disease

## 2023-10-28 ENCOUNTER — Other Ambulatory Visit: Payer: Self-pay | Admitting: Cardiovascular Disease

## 2023-10-29 ENCOUNTER — Other Ambulatory Visit: Payer: Self-pay | Admitting: Cardiovascular Disease

## 2023-11-20 ENCOUNTER — Other Ambulatory Visit: Payer: Self-pay | Admitting: Cardiovascular Disease

## 2023-11-23 ENCOUNTER — Other Ambulatory Visit: Payer: Self-pay

## 2023-11-23 MED ORDER — HYDROCHLOROTHIAZIDE 25 MG PO TABS: 25.0000 mg | ORAL_TABLET | Freq: Every day | ORAL | 0 refills | Status: DC

## 2023-11-23 MED ORDER — POTASSIUM CHLORIDE CRYS ER 20 MEQ PO TBCR: EXTENDED_RELEASE_TABLET | ORAL | 0 refills | Status: DC

## 2024-01-13 ENCOUNTER — Ambulatory Visit: Payer: PPO | Attending: Cardiovascular Disease | Admitting: Cardiovascular Disease

## 2024-01-13 ENCOUNTER — Encounter: Payer: Self-pay | Admitting: Cardiovascular Disease

## 2024-01-13 VITALS — BP 130/84 | HR 54 | Ht 73.0 in | Wt 187.2 lb

## 2024-01-13 DIAGNOSIS — I2510 Atherosclerotic heart disease of native coronary artery without angina pectoris: Secondary | ICD-10-CM

## 2024-01-13 DIAGNOSIS — E785 Hyperlipidemia, unspecified: Secondary | ICD-10-CM | POA: Diagnosis not present

## 2024-01-13 DIAGNOSIS — I10 Essential (primary) hypertension: Secondary | ICD-10-CM

## 2024-01-13 NOTE — Patient Instructions (Signed)
Lab Work: Lipids, ALT, BMET today If you have labs (blood work) drawn today and your tests are completely normal, you will receive your results only by: MyChart Message (if you have MyChart) OR A paper copy in the mail If you have any lab test that is abnormal or we need to change your treatment, we will call you to review the results.  Follow-Up: At [facility], you and your health needs are our priority.  As part of our continuing mission to provide you with exceptional heart care, we have created designated Provider Care Teams.  These Care Teams include your primary Cardiologist (physician) and Advanced Practice Providers (APPs -  Physician Assistants and Nurse Practitioners) who all work together to provide you with the care you need, when you need it.  Your next appointment:   1 year(s)  Provider:   Tonny Bollman, MD     1st Floor: - Lobby - Registration  - Pharmacy  - Lab - Cafe  2nd Floor: - PV Lab - Diagnostic Testing (echo, CT, nuclear med)  3rd Floor: - Vacant  4th Floor: - TCTS (cardiothoracic surgery) - AFib Clinic - Structural Heart Clinic - Vascular Surgery  - Vascular Ultrasound  5th Floor: - HeartCare Cardiology (general and EP) - Clinical Pharmacy for coumadin, hypertension, lipid, weight-loss medications, and med management appointments    Valet parking services will be available as well.

## 2024-01-13 NOTE — Progress Notes (Signed)
Vincent Brock. Date of Birth  May 24, 1941 [facility]        [address]    [address]     [place], [place]  [address]      [phone]  Fax  [phone]     Problem list:  1. Coronary artery disease: Status post PTCA and stenting of the RCA in 2000. Who placed a 4.0 x 18 mm NIR Royal post dilated up to 4.4 mm 2. Hyperlipidemia 3. Hypertension 4. Prostate cancer   83 yo with cad.  No chest pain.  Works out every day.  Had knee surgery several months ago.    Sept. 12, 2014:  No angina.  Has had some BP issues - up and down.   Typical readings are 130 / 85.  Lots of anxiety at night.   He has been going to the [facility].    Sept. 17, 2015:  Doing well.  No dyspnea, no CP,  Fatigues eaily.   Working out at [employer].  Sept. 27, 2016:  Doing well.  No CP ,  BP is a bit higher today . Typically is i the normal range.   Jan. 25, 2018:   Vincent Brock is doing well.   Has not been exercising as much.  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  Wears him out mentally - is not getting much exercise.  BP has been good   Apr 21, 2018:  Vincent Brock is seen today for follow-up visit.  He has a history of coronary artery disease with stenting. No CP or dyspnea is not exercising , Needs to get back into that  Is driving for [employer]  - not much time for walking   Sept. 11, 2020   Vincent Brock is seen today.  No CP , no dyspnea.  Is not exercising  Has gained some weight.  May be eating more salt,  Is definitely more anxious .  Has not been going to the [facility]   June 12, 2020 Vincent Brock is seen today for follow up for his CAD, HLD, HTN. No cp , no dyspnea,  Is not exercising much  Quit driving for [employer] 8 months ago .  Has gotten "lazy"   July 09, 2021: Vincent Brock is seen today for follow up for his CAD, HLD, HTN Not much exercise . No angina   Sept. 5, 2023 Vincent Brock is seen today for follow up of his CAD, HLD, HTN   Sept. 14,  2023 Vincent Brock is seen today for follow up of his HTN, CAD, HLD , RBBB   No CP , no dyspnea.  Wife Larene Beach has progrossive dementia  Had breast surgery 6 months ago - she did well with the surgery   Jan. 22, 2025 Vincent Brock is seen for follow up of his HTN, CAD, HLD     Current Outpatient Medications on File Prior to Visit  Medication Sig Dispense Refill   aspirin 81 MG EC tablet Take 81 mg by mouth daily.       atorvastatin (LIPITOR) 80 MG tablet TAKE 1 TABLET BY MOUTH IN THE EVENING .  APPOINTMENT  REQUIRED  FOR  FUTURE  REFILLS. 90 tablet 0   Coenzyme Q10 (CO Q 10 PO) Take by mouth daily.     cyclobenzaprine (FLEXERIL) 10 MG tablet Take 1 tablet (10 mg total) by mouth 2 (two) times daily  as needed for muscle spasms. 10 tablet 0   diclofenac Sodium (VOLTAREN) 1 % GEL Apply topically as needed.     hydrochlorothiazide (HYDRODIURIL) 25 MG tablet Take 1 tablet (25 mg total) by mouth daily. 90 tablet 0   metoprolol tartrate (LOPRESSOR) 25 MG tablet Take 1 tablet by mouth twice daily 60 tablet 2   nitroGLYCERIN (NITROSTAT) 0.4 MG SL tablet One tablet under tongue up to three times if pain continues after 3 doses call 911 25 tablet 3   Omega-3 Fatty Acids (FISH OIL PO) Take by mouth daily. (Total 5,000)     potassium chloride SA (KLOR-CON M20) 20 MEQ tablet TAKE 1  BY MOUTH ONCE DAILY. 90 tablet 0   No current facility-administered medications on file prior to visit.    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   Arthritis    Chicken pox    Coronary artery disease    status post PTCA and stenting of his right coronary artery in 2000   Frequent headaches    Heart disease    History of myocardial infarction 07/15/1999   cardiac catherization revealed a tight 99% stenosis in mid right coronary artery -- with successful PTCA and stenting  of mid right   History of prostate cancer    Hyperlipidemia    Hypertension    Kidney stones     Past Surgical History:  Procedure Laterality Date   APPENDECTOMY     CORONARY ANGIOPLASTY WITH STENT PLACEMENT  2000   stenting of RCA   RETINAL DETACHMENT REPAIR W/ SCLERAL BUCKLE LE  06/09/02   Retinal detachment, right eye   ROBOT ASSISTED LAPAROSCOPIC RADICAL PROSTATECTOMY  07/06/2006   Clinical stage T1C adenocarcinoma of the prostate   UMBILICAL HERNIA REPAIR  10/17/2004    Social History   Tobacco Use  Smoking Status Former   Current packs/day: 0.00   Types: Cigarettes   Quit date: 10/22/2005   Years since quitting: 18.2  Smokeless Tobacco Never    Social History   Substance and Sexual Activity  Alcohol Use No    Family History  Problem Relation Age of Onset   Heart attack Father 44   Tuberculosis Father    Heart failure Father    Cirrhosis Mother    Coronary artery disease Sister        with stents    Reviw of Systems:  Reviewed in the HPI.  All other systems are negative.   Physical Exam: Blood pressure 130/84, pulse (!) 54, height 6\' 1"  (1.854 m), weight 187 lb 3.2 oz (84.9 kg), SpO2 98%.       GEN:  Well nourished, well developed in no acute distress HEENT: Normal NECK: No JVD; No carotid bruits LYMPHATICS: No lymphadenopathy CARDIAC: RRR , no murmurs, rubs, gallops RESPIRATORY:  Clear to auscultation without rales, wheezing or rhonchi  ABDOMEN: Soft, non-tender, non-distended MUSCULOSKELETAL:  No edema; No deformity  SKIN: Warm and dry NEUROLOGIC:  Alert and oriented x 3   ECG:     EKG Interpretation Date/Time:  Wednesday January 13 2024 14:30:42 EST Ventricular Rate:  56 PR Interval:  292 QRS Duration:  138 QT Interval:  444 QTC Calculation: 428 R Axis:   67  Text Interpretation: Sinus bradycardia with 1st degree A-V block Right bundle branch block When  compared with ECG of 14- Sept- 2023 No significant change since last tracing  Confirmed by Kristeen Miss [phone]) on 01/13/2024 2:35:38 PM      Assessment / Plan:   1. Coronary artery disease: Status post PTCA and stenting of the RCA in 2000.  4.0 x 18 mm NIR Royal post dilated up to 4.4 mm  He is doing well.  Is not having any angina .  Cont meds    2. Hyperlipidemia -       his last LDL was 57.  Will check labs today    3. Hypertension: .  BP is well controlled.    4. Prostate cancer:     Kristeen Miss, MD  01/13/2024 2:49 PM    [facility] [address] [place],  [address] [place], [place]  [address] Pager [phone] Phone: [phone]; Fax: [phone]

## 2024-01-14 LAB — BASIC METABOLIC PANEL
BUN/Creatinine Ratio: 15 (ref 10–24)
BUN: 17 mg/dL (ref 8–27)
CO2: 26 mmol/L (ref 20–29)
Calcium: 9.4 mg/dL (ref 8.6–10.2)
Chloride: 100 mmol/L (ref 96–106)
Creatinine, Ser: 1.16 mg/dL (ref 0.76–1.27)
Glucose: 93 mg/dL (ref 70–99)
Potassium: 4.9 mmol/L (ref 3.5–5.2)
Sodium: 140 mmol/L (ref 134–144)
eGFR: 63 mL/min/{1.73_m2} (ref >59)

## 2024-01-14 LAB — LIPID PANEL
Chol/HDL Ratio: 2.2 {ratio} (ref 0.0–5.0)
Cholesterol, Total: 131 mg/dL (ref 100–199)
HDL: 59 mg/dL (ref >39)
LDL Chol Calc (NIH): 59 mg/dL (ref 0–99)
Triglycerides: 59 mg/dL (ref 0–149)
VLDL Cholesterol Cal: 13 mg/dL (ref 5–40)

## 2024-01-14 LAB — ALT: ALT: 17 [IU]/L (ref 0–44)

## 2024-01-18 ENCOUNTER — Encounter: Payer: Self-pay | Admitting: Cardiovascular Disease

## 2024-01-25 ENCOUNTER — Ambulatory Visit: Payer: Self-pay

## 2024-01-25 NOTE — Telephone Encounter (Signed)
  Chief Complaint: lump under right armpit Symptoms: lump, redness, pain Frequency: 1 week Pertinent Negatives: Patient denies fever, warmth, streaking Disposition: [] ED /[x] Urgent Care (no appt availability in office) / [] Appointment(In office/virtual)/ []  [facility]/ [] Home Care/ [] Refused Recommended Disposition /[]  Mobile Bus/ []  Follow-up with PCP Additional Notes: Patient calls reporting hard knot under L armpit that is growing in size, red, painful with pressure. Per protocol, patient to be evaluated within 24 hours. Patient not established with provider here, patient agreeable to be seen in urgent care. During call, new patient appt scheduled as well at patient request. Care advice reviewed, patient verbalized understanding.    Copied from CRM [id number]. Topic: Clinical - Pink Word Triage >> Jan 25, 2024 11:03 AM Kathryne Eriksson wrote: Reason for Triage: Concerns Of Bump (Underneath Left Arm/Armpit) >> Jan 25, 2024 11:20 AM Kathryne Eriksson wrote: Concerns Of Bump (Underneath Left Arm/Armpit), Patient states the sight is sore and growing  Reason for Disposition  [1] Swelling is painful to touch AND [2] no fever  Answer Assessment - Initial Assessment Questions 1. APPEARANCE of SWELLING: "What does it look like?"     Red, swollen, hard knot 2. SIZE: "How large is the swelling?" (e.g., inches, cm; or compare to size of pinhead, tip of pen, eraser, coin, pea, grape, ping pong ball)      Between a nickel and a quarter in size, doubled in size over the weekend. 3. LOCATION: "Where is the swelling located?"     L under armpit 4. ONSET: "When did the swelling start?"     Several months, worsening over the last week. 5. COLOR: "What color is it?" "Is there more than one color?"     Red- one area. 6. PAIN: "Is there any pain?" If Yes, ask: "How bad is the pain?" (e.g., scale 1-10; or mild, moderate, severe)     - NONE (0): no pain   - MILD (1-3): doesn't interfere with  normal activities    - MODERATE (4-7): interferes with normal activities or awakens from sleep    - SEVERE (8-10): excruciating pain, unable to do any normal activities     1/10, hurts with pressure 7. ITCH: "Does it itch?" If Yes, ask: "How bad is the itch?"      Denies 8. CAUSE: "What do you think caused the swelling?" 9 OTHER SYMPTOMS: "Do you have any other symptoms?" (e.g., fever)     Denies  Protocols used: Skin Lump or Localized Swelling-A-AH

## 2024-01-26 ENCOUNTER — Other Ambulatory Visit: Payer: Self-pay | Admitting: Cardiovascular Disease

## 2024-01-29 ENCOUNTER — Other Ambulatory Visit: Payer: Self-pay | Admitting: Cardiovascular Disease

## 2024-02-16 ENCOUNTER — Other Ambulatory Visit: Payer: Self-pay | Admitting: Cardiovascular Disease

## 2024-02-16 ENCOUNTER — Other Ambulatory Visit: Payer: Self-pay

## 2024-02-16 MED ORDER — HYDROCHLOROTHIAZIDE 25 MG PO TABS: 25.0000 mg | ORAL_TABLET | Freq: Every day | ORAL | 3 refills | Status: AC

## 2024-02-16 MED ORDER — POTASSIUM CHLORIDE CRYS ER 20 MEQ PO TBCR: EXTENDED_RELEASE_TABLET | ORAL | 3 refills | Status: AC

## 2024-04-28 ENCOUNTER — Ambulatory Visit: Payer: PPO | Admitting: Family Medicine

## 2024-07-22 ENCOUNTER — Emergency Department (HOSPITAL_BASED_OUTPATIENT_CLINIC_OR_DEPARTMENT_OTHER)
Admission: EM | Admit: 2024-07-22 | Discharge: 2024-07-22 | Disposition: A | Discharge status: Home / Self Care | Attending: Emergency Medicine | Admitting: Emergency Medicine

## 2024-07-22 ENCOUNTER — Other Ambulatory Visit: Payer: Self-pay

## 2024-07-22 ENCOUNTER — Encounter (HOSPITAL_BASED_OUTPATIENT_CLINIC_OR_DEPARTMENT_OTHER): Payer: Self-pay

## 2024-07-22 DIAGNOSIS — W260XXA Contact with knife, initial encounter: Secondary | ICD-10-CM | POA: Insufficient documentation

## 2024-07-22 DIAGNOSIS — S65312A Laceration of deep palmar arch of left hand, initial encounter: Secondary | ICD-10-CM | POA: Insufficient documentation

## 2024-07-22 DIAGNOSIS — Z79899 Other long term (current) drug therapy: Secondary | ICD-10-CM | POA: Insufficient documentation

## 2024-07-22 DIAGNOSIS — Z7982 Long term (current) use of aspirin: Secondary | ICD-10-CM | POA: Insufficient documentation

## 2024-07-22 DIAGNOSIS — S61412A Laceration without foreign body of left hand, initial encounter: Secondary | ICD-10-CM | POA: Diagnosis not present

## 2024-07-22 DIAGNOSIS — S61432A Puncture wound without foreign body of left hand, initial encounter: Secondary | ICD-10-CM | POA: Insufficient documentation

## 2024-07-22 DIAGNOSIS — S6992XA Unspecified injury of left wrist, hand and finger(s), initial encounter: Secondary | ICD-10-CM | POA: Diagnosis present

## 2024-07-22 MED ORDER — LIDOCAINE-EPINEPHRINE 2 %-1:100000 IJ SOLN
20.0000 mL | Freq: Once | INTRAMUSCULAR | Status: DC
Start: 2024-07-22 — End: 2024-07-22

## 2024-07-22 MED ORDER — LIDOCAINE-EPINEPHRINE (PF) 2 %-1:200000 IJ SOLN
INTRAMUSCULAR | Status: AC
  Filled 2024-07-22: qty 20

## 2024-07-22 NOTE — ED Notes (Signed)

## 2024-07-22 NOTE — ED Triage Notes (Signed)
 Arrives POV with complaints of left hand injury. Patient was trying to open a juice with a kitchen knife and stuck himself in the left hand. Patient reports swelling/bruising to site. No pain. He does not take blood thinners (only a 81mg  asprin daily).

## 2024-07-22 NOTE — ED Provider Notes (Signed)
 [facility] Provider Note   CSN: [id number] Arrival date & time: 07/22/24  1011     Patient presents with: Hand Injury (left)   Vincent Brock. is a 83 y.o. male presents emergency department chief complaint of injury to his left hand.  Patient states he was using a knife to try to break a plastic seal on a bottle of orange juice when he slipped and stabbed himself in the left hand.  He states that he had significant amount of bleeding from the left hand and could not get it to stop so presented here.  He is up-to-date on his tetanus vaccination.  He is right-hand dominant.  He denies any numbness tingling or weakness in the left hand.    Hand Injury      Prior to Admission medications   Medication Sig Start Date End Date Taking? Authorizing Provider  aspirin 81 MG EC tablet Take 81 mg by mouth daily.      [provider]  atorvastatin  (LIPITOR) 80 MG tablet Take 1 tablet (80 mg total) by mouth every evening. 01/26/24   Nahser, Aleene PARAS, MD  Coenzyme Q10 (CO Q 10 PO) Take by mouth daily.    [provider]  cyclobenzaprine  (FLEXERIL ) 10 MG tablet Take 1 tablet (10 mg total) by mouth 2 (two) times daily as needed for muscle spasms. 12/05/18   Dreama Longs, MD  diclofenac  Sodium (VOLTAREN ) 1 % GEL Apply topically as needed.    [provider]  hydrochlorothiazide  (HYDRODIURIL ) 25 MG tablet Take 1 tablet (25 mg total) by mouth daily. 02/16/24   Nahser, Aleene PARAS, MD  metoprolol  tartrate (LOPRESSOR ) 25 MG tablet Take 1 tablet by mouth twice daily 01/29/24   Nahser, Aleene PARAS, MD  nitroGLYCERIN  (NITROSTAT ) 0.4 MG SL tablet One tablet under tongue up to three times if pain continues after 3 doses call 911 03/22/16   Antonio Meth, Jamee R, DO  Omega-3 Fatty Acids (FISH OIL PO) Take by mouth daily. (Total 5,000)    [provider]  potassium chloride  SA (KLOR-CON  M20) 20 MEQ tablet TAKE 1  BY MOUTH ONCE DAILY.  02/16/24   Nahser, Aleene PARAS, MD    Allergies: Codeine    Review of Systems  Updated Vital Signs BP (!) 134/95 (BP Location: Right Arm)   Pulse 77   Temp (!) 97.4 F (36.3 C) (Oral)   Resp 20   Ht 6' 1 (1.854 m)   Wt 88.5 kg   SpO2 100%   BMI 25.73 kg/m   Physical Exam Vitals and nursing note reviewed.  Constitutional:      General: He is not in acute distress.    Appearance: He is well-developed. He is not diaphoretic.  HENT:     Head: Normocephalic and atraumatic.  Eyes:     General: No scleral icterus.    Conjunctiva/sclera: Conjunctivae normal.  Cardiovascular:     Rate and Rhythm: Normal rate and regular rhythm.     Heart sounds: Normal heart sounds.  Pulmonary:     Effort: Pulmonary effort is normal. No respiratory distress.     Breath sounds: Normal breath sounds.  Abdominal:     Palpations: Abdomen is soft.     Tenderness: There is no abdominal tenderness.  Musculoskeletal:     Cervical back: Normal range of motion and neck supple.     Comments: Patient with a small puncture wound between the webbing of the 1st and  2nd digit on the left hand there is a large moderately tense hematoma under the and of the left hand.  Patient has normal sensations in the fifth fingers including radial median and ulnar nerve and has maintained normal strength and movement He has full range of motion including abduction circumduction and flexion extension at the DIP of his thumb.   Skin:    General: Skin is warm and dry.  Neurological:     Mental Status: He is alert.  Psychiatric:        Behavior: Behavior normal.        (all labs ordered are listed, but only abnormal results are displayed) Labs Reviewed - No data to display  EKG: None  Radiology: No results found.   .Laceration Repair  Date/Time: 07/22/2024 12:58 PM  Performed by: Arloa Chroman, PA-C Authorized by: Arloa Chroman, PA-C   Consent:    Consent obtained:  Verbal   Risks discussed:  Need for  additional repair, pain and infection Universal protocol:    Patient identity confirmed:  Verbally with patient Anesthesia:    Anesthesia method:  Local infiltration   Local anesthetic:  Lidocaine  2% WITH epi Laceration details:    Length (cm):  4 Pre-procedure details:    Preparation:  Patient was prepped and draped in usual sterile fashion Exploration:    Limited defect created (wound extended): yes     Hemostasis achieved with:  Tied off vessels   Wound exploration: wound explored through full range of motion and entire depth of wound visualized     Wound extent: vascular damage   Treatment:    Area cleansed with:  Shur-Clens and chlorhexidine   Irrigation solution:  Sterile saline   Irrigation method:  Pressure wash Skin repair:    Repair method:  Sutures   Suture size:  5-0   Suture material:  Prolene (monocryl)   Number of sutures:  3 (2 figure 8 to tie of artery, 1 horizontal mattress to close the laceration) Approximation:    Approximation:  Loose Repair type:    Repair type:  Complex Post-procedure details:    Dressing:  Splint for protection, sterile dressing and non-adherent dressing   Procedure completion:  Tolerated well, no immediate complications Comments:     Patient with large hematoma, under sterile technique I extended the puncture wound using a 15 blade scalpel from 1/2 cm to about a 4 cm superficial horizontal laceration over the webbing between the thumb and the first finger.  I then reevaluated the patient's strength and function of the 1st and 2nd digits.  I evacuated as much hematoma as possible both manually and irrigated under the tissue with sterile saline.  After removing a lot of the clot there was a clear arterial bleed from the initial wound site.  I used 5-0 Monocryl and through 2 figure-of-eight stitches to tie the vessel off and achieved hemostasis.  I then loosely approximated the 4 cm laceration with 5-0 Prolene keeping it loosely approximated with  a horizontal mattress stitch.  As there was still some venous oozing which was not in any way as brisk as the arterial bleed.  I then applied a pressure dressing patient placed in a thumb spica Velcro splint.    Medications Ordered in the ED - No data to display  Clinical Course as of 07/22/24 1111  Fri Jul 22, 2024  1055 Case discussed with Ozell Purchase.  As this occurred this morning and the hematoma has not had an extensive amount  of time to create pressure on the overlying skin current plan is to increase the size of the puncture wound and evacuate as much hematoma as possible in order to try and salvage the overlying skin.  With close outpatient follow-up. [AH]    Clinical Course User Index [AH] Arloa Chroman, PA-C                                 Medical Decision Making Risk Prescription drug management.   Patient w/Puncture wound to the left hand.  Patient does not use any blood thinners.  After evacuating the hematoma I found a small arterial bleed and hematoma evacuation and turned into a laceration repair with hemostasis of achieved by tying off the vessel.  I then loosely closed the extended incision with horizontal mattress suture.  Hemostasis achieved with only mild oozing.  I then wrapped the wound and patient was given specific instructions for home wound management, bleeding control return precautions and outpatient follow-up with Dr. Lorretta this coming week.     Final diagnoses:  None    ED Discharge Orders     None          Arloa Chroman, PA-C 07/25/24 1245    Tegeler, Lonni PARAS, MD 07/25/24 1430

## 2024-07-22 NOTE — Discharge Instructions (Signed)
 Leave your bandage in place over the weekend.  If you see Dr. Lorretta next Monday or Tuesday then you may keep the bandage on until then however if you are not seeing him until later in the week you may remove the bandage and gently clean the wound with regular Dial soap and water twice a day and change her bandage.  I would advise you to continue to wear the thumb spica splint to reduce movement while the wound is healing. If you have significant bleeding and need to change her dressing earlier remove it and apply direct pressure to the wound holding nonstop pressure for at least 20 minutes.  If you are continuing to bleed after that return to the emergency department for further evaluation. Get help right away for any signs or symptoms of infection including increased pain, redness, heat, swelling or fevers.

## 2024-10-21 DIAGNOSIS — S61432A Puncture wound without foreign body of left hand, initial encounter: Secondary | ICD-10-CM | POA: Diagnosis not present

## 2024-10-25 ENCOUNTER — Other Ambulatory Visit (HOSPITAL_COMMUNITY): Payer: Self-pay

## 2024-10-26 ENCOUNTER — Ambulatory Visit: Attending: Cardiology | Admitting: Cardiology

## 2024-10-26 ENCOUNTER — Encounter: Payer: Self-pay | Admitting: Cardiology

## 2024-10-26 VITALS — BP 122/72 | HR 65 | Resp 16 | Ht 73.0 in | Wt 146.4 lb

## 2024-10-26 DIAGNOSIS — I10 Essential (primary) hypertension: Secondary | ICD-10-CM

## 2024-10-26 DIAGNOSIS — I2510 Atherosclerotic heart disease of native coronary artery without angina pectoris: Secondary | ICD-10-CM

## 2024-10-26 DIAGNOSIS — E785 Hyperlipidemia, unspecified: Secondary | ICD-10-CM

## 2024-10-26 MED ORDER — ATORVASTATIN CALCIUM 80 MG PO TABS: 80.0000 mg | ORAL_TABLET | Freq: Every evening | ORAL | 3 refills | Status: AC

## 2024-10-26 NOTE — Progress Notes (Signed)
  Cardiology Office Note:  .   Date:  10/26/2024  ID:  Vincent Brock., DOB Jul 02, 1941, MRN [id number] PCP: [facility] Providers Cardiologist:  Vincent Fell, MD {  History of Present Illness: .   Vincent Brock. is a 83 y.o. male with history of CAD status post PTCA/stenting of RCA 2000, hyperlipidemia, hypertension, prostate cancer status post prostatectomy   Patient has remote history of stenting to his RCA.  He has been a long-term patient of Dr. Alveta and has done well for multiple years now and without any issues.  Has not needed further cardiac workup since his remote stenting.  He is seen on an annual basis and had no complaints last year.  Today patient presents with his wife for annual follow-up.  He has no acute complaints.  Still remains to be active and denies any chest pain or exertional symptoms.  Able to complete all of his ADLs without any issue.  He does live in a condo and able to ambulate up the steps without trouble.  He is moderately active 3-4 times per week.  He still does push-ups.  Compliant with all of his medication.   ROS: Denies: Chest pain, shortness of breath, orthopnea, peripheral edema, palpitations, decreased exercise intolerance, fatigue, lightheadedness.   Studies Reviewed: .         Risk Assessment/Calculations:             Physical Exam:   VS:  BP 122/72   Pulse 65   Resp 16   Ht 6' 1 (1.854 m)   Wt 146 lb 6.4 oz (66.4 kg)   SpO2 97%   BMI 19.32 kg/m    Wt Readings from Last 3 Encounters:  10/26/24 146 lb 6.4 oz (66.4 kg)  07/22/24 195 lb (88.5 kg)  01/13/24 187 lb 3.2 oz (84.9 kg)    GEN: Well nourished, well developed in no acute distress NECK: No JVD; No carotid bruits CARDIAC: RRR, no murmurs, rubs, gallops RESPIRATORY:  Clear to auscultation without rales, wheezing or rhonchi  ABDOMEN: Soft, non-tender, non-distended EXTREMITIES:  No edema; No deformity   ASSESSMENT AND PLAN: .     CAD Hyperlipidemia - Remote PTCA/stenting of RCA 2000  Has done exceptionally well for multiple years now.  Stable disease without any anginal complaints or equivalents. Continue with aspirin, atorvastatin  80 mg, Lopressor  25 mg twice daily. Cholesterol is very close to target goal less than 55.  12/2023 LDL was 59.  Continue lifestyle modifications.  If need be could consider Zetia in the future.  Hypertension Well-controlled on today's check 122/72. Continue with hydrochlorothiazide  25 mg daily.  He is also on supplemental potassium 20 mEq daily. Has as needed nitroglycerin  in which he never has to use.   Dispo: 1 year follow-up with Dr. Fell to establish care.  Signed, Vincent LITTIE Sluder, PA-C

## 2024-10-26 NOTE — Patient Instructions (Signed)
 Medication Instructions:   Your physician recommends that you continue on your current medications as directed. Please refer to the Current Medication list given to you today.  *If you need a refill on your cardiac medications before your next appointment, please call your pharmacy*    Follow-Up: At [facility], you and your health needs are our priority.  As part of our continuing mission to provide you with exceptional heart care, our providers are all part of one team.  This team includes your primary Cardiologist (physician) and Advanced Practice Providers or APPs (Physician Assistants and Nurse Practitioners) who all work together to provide you with the care you need, when you need it.  Your next appointment:   1 year(s)  Provider:   Arnoldo Lapping, MD

## 2025-01-25 ENCOUNTER — Other Ambulatory Visit: Payer: Self-pay | Admitting: Cardiovascular Disease

## 2025-01-25 MED ORDER — METOPROLOL TARTRATE 25 MG PO TABS: 25.0000 mg | ORAL_TABLET | Freq: Two times a day (BID) | ORAL | 3 refills | Status: AC
